# Patient Record
Sex: Male | Born: 1990 | Race: Black or African American | Hispanic: No | Marital: Single | Smoking: Former smoker
Health system: Southern US, Community
[De-identification: ages and names within clinical notes are randomized; demographics above are authoritative.]

## PROBLEM LIST (undated history)

## (undated) DIAGNOSIS — K219 Gastro-esophageal reflux disease without esophagitis: Secondary | ICD-10-CM

## (undated) DIAGNOSIS — E041 Nontoxic single thyroid nodule: Secondary | ICD-10-CM

## (undated) DIAGNOSIS — J302 Other seasonal allergic rhinitis: Secondary | ICD-10-CM

## (undated) DIAGNOSIS — K5090 Crohn's disease, unspecified, without complications: Secondary | ICD-10-CM

## (undated) DIAGNOSIS — K50013 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: Principal | ICD-10-CM

## (undated) HISTORY — PX: COLONOSCOPY: SHX174

## (undated) HISTORY — DX: Crohn's disease, unspecified, without complications: K50.90

## (undated) HISTORY — DX: 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: K50.013

## (undated) HISTORY — DX: Nontoxic single thyroid nodule: E04.1

---
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
## 2012-02-02 ENCOUNTER — Ambulatory Visit
Admission: RE | Admit: 2012-02-02 | Discharge: 2012-02-02 | Disposition: A | Discharge status: Home / Self Care | Payer: Self-pay | Source: Ambulatory Visit | Attending: Gastroenterology | Admitting: Gastroenterology

## 2012-02-02 ENCOUNTER — Other Ambulatory Visit: Payer: Self-pay | Admitting: Gastroenterology

## 2012-02-02 DIAGNOSIS — R1110 Vomiting, unspecified: Secondary | ICD-10-CM

## 2012-02-02 DIAGNOSIS — K5090 Crohn's disease, unspecified, without complications: Secondary | ICD-10-CM

## 2012-02-02 MED ORDER — IOHEXOL 300 MG/ML  SOLN
100.0000 mL | Freq: Once | INTRAMUSCULAR | Status: AC | PRN
  Administered 2012-02-02: 100 mL via INTRAVENOUS

## 2012-02-08 ENCOUNTER — Ambulatory Visit: Payer: Self-pay | Admitting: Pediatrics

## 2012-02-08 ENCOUNTER — Encounter: Payer: Self-pay | Admitting: Pediatrics

## 2012-02-08 ENCOUNTER — Encounter: Payer: Self-pay | Admitting: *Deleted

## 2012-03-21 ENCOUNTER — Other Ambulatory Visit: Payer: Self-pay | Admitting: Gastroenterology

## 2012-03-21 DIAGNOSIS — R112 Nausea with vomiting, unspecified: Secondary | ICD-10-CM

## 2012-03-21 DIAGNOSIS — K5090 Crohn's disease, unspecified, without complications: Secondary | ICD-10-CM

## 2012-03-25 ENCOUNTER — Ambulatory Visit
Admission: RE | Admit: 2012-03-25 | Discharge: 2012-03-25 | Disposition: A | Discharge status: Home / Self Care | Payer: BC Managed Care – PPO | Source: Ambulatory Visit | Attending: Gastroenterology | Admitting: Gastroenterology

## 2012-03-25 DIAGNOSIS — K5090 Crohn's disease, unspecified, without complications: Secondary | ICD-10-CM

## 2012-03-25 DIAGNOSIS — R112 Nausea with vomiting, unspecified: Secondary | ICD-10-CM

## 2012-03-30 ENCOUNTER — Telehealth (INDEPENDENT_AMBULATORY_CARE_PROVIDER_SITE_OTHER): Payer: Self-pay | Admitting: General Surgery

## 2012-03-30 NOTE — Telephone Encounter (Signed)
Left message on machine for patient to call back for appt. Appt made for 04/04/12 @ 3:00 pm per Dr Michaell Cowing to evaluate stricture.

## 2012-03-30 NOTE — Telephone Encounter (Signed)
Spoke to Ririe, his mother, and given appt.

## 2012-04-02 ENCOUNTER — Emergency Department (HOSPITAL_COMMUNITY)
Admission: EM | Admit: 2012-04-02 | Discharge: 2012-04-02 | Disposition: A | Discharge status: Home / Self Care | Payer: BC Managed Care – PPO | Attending: Emergency Medicine | Admitting: Emergency Medicine

## 2012-04-02 ENCOUNTER — Emergency Department (HOSPITAL_COMMUNITY): Payer: BC Managed Care – PPO

## 2012-04-02 ENCOUNTER — Encounter (HOSPITAL_COMMUNITY): Payer: Self-pay

## 2012-04-02 DIAGNOSIS — R109 Unspecified abdominal pain: Secondary | ICD-10-CM | POA: Insufficient documentation

## 2012-04-02 DIAGNOSIS — Z932 Ileostomy status: Secondary | ICD-10-CM | POA: Insufficient documentation

## 2012-04-02 DIAGNOSIS — R1110 Vomiting, unspecified: Secondary | ICD-10-CM | POA: Insufficient documentation

## 2012-04-02 DIAGNOSIS — Z8719 Personal history of other diseases of the digestive system: Secondary | ICD-10-CM

## 2012-04-02 DIAGNOSIS — R10819 Abdominal tenderness, unspecified site: Secondary | ICD-10-CM | POA: Insufficient documentation

## 2012-04-02 DIAGNOSIS — K5090 Crohn's disease, unspecified, without complications: Secondary | ICD-10-CM | POA: Insufficient documentation

## 2012-04-02 LAB — CBC
HCT: 33.9 % — ABNORMAL LOW (ref 39.0–52.0)
Hemoglobin: 12.1 g/dL — ABNORMAL LOW (ref 13.0–17.0)
MCH: 32.6 pg (ref 26.0–34.0)
MCHC: 35.7 g/dL (ref 30.0–36.0)
MCV: 91.4 fL (ref 78.0–100.0)
RBC: 3.71 MIL/uL — ABNORMAL LOW (ref 4.22–5.81)

## 2012-04-02 LAB — COMPREHENSIVE METABOLIC PANEL
Albumin: 4.2 g/dL (ref 3.5–5.2)
Alkaline Phosphatase: 44 U/L (ref 39–117)
BUN: 12 mg/dL (ref 6–23)
Creatinine, Ser: 0.86 mg/dL (ref 0.50–1.35)
GFR calc Af Amer: >90 mL/min (ref >90)
Glucose, Bld: 86 mg/dL (ref 70–99)
Potassium: 3.6 mEq/L (ref 3.5–5.1)
Total Bilirubin: 1.7 mg/dL — ABNORMAL HIGH (ref 0.3–1.2)
Total Protein: 6.4 g/dL (ref 6.0–8.3)

## 2012-04-02 LAB — DIFFERENTIAL
Basophils Relative: 0 % (ref 0–1)
Eosinophils Absolute: 0 10*3/uL (ref 0.0–0.7)
Eosinophils Relative: 1 % (ref 0–5)
Lymphs Abs: 0.7 10*3/uL (ref 0.7–4.0)
Monocytes Absolute: 0.8 10*3/uL (ref 0.1–1.0)
Monocytes Relative: 12 % (ref 3–12)
Neutrophils Relative %: 76 % (ref 43–77)

## 2012-04-02 MED ORDER — SODIUM CHLORIDE 0.9 % IV SOLN
Freq: Once | INTRAVENOUS | Status: AC
  Administered 2012-04-02: 10:00:00 via INTRAVENOUS

## 2012-04-02 MED ORDER — ONDANSETRON HCL 4 MG/2ML IJ SOLN
4.0000 mg | Freq: Once | INTRAMUSCULAR | Status: AC
  Administered 2012-04-02: 4 mg via INTRAVENOUS
  Filled 2012-04-02: qty 2

## 2012-04-02 NOTE — ED Notes (Addendum)
Pt.having vomitng for over 1 week, has a hx of Crohns, diagnosed with stricture at the [facility]  [facility] this week by Ct and Endoscopy.   Pt. Is becoming worse with n/v and pain.  Has an appointment for this MOnday with a surgeon, but mom believes he needs treatment now due to the increase pain, n/v

## 2012-04-02 NOTE — ED Notes (Signed)
Pt c/o (R) mid side abd pain and N/V x1 week, pt reports vomiting x5 yesterday, pt is able to keep down some foods and fluids w/vomiting. Pt denies diarrhea, chest/back pain, or sob. Pt has a hx of Chron's dx and recently had a procedure completed by Dr. Michaell Cowing and is scheduled to see Dr. Michaell Cowing on Monday for another possible abd surgery

## 2012-04-02 NOTE — Discharge Instructions (Signed)

## 2012-04-02 NOTE — ED Notes (Signed)
Patient transported to X-ray 

## 2012-04-02 NOTE — ED Notes (Signed)
Pt undressing and getting into a gown; blankets given; family at bedside

## 2012-04-02 NOTE — ED Provider Notes (Signed)
History     CSN: [id number]  Arrival date & time 04/02/12  4098   First MD Initiated Contact with Patient 04/02/12 [phone]      Chief Complaint  Patient presents with  . Emesis    (Consider location/radiation/quality/duration/timing/severity/associated sxs/prior treatment) HPI Comments: Patient with history of Crohn's Disease status post ileostomy with reversal in the past.  Has been having abd pain and vomiting recently and underwent colonoscopy showing a stenosis of the ileocolic anastomosis.  Presents today complaining of no bowel movement for several days, increased abd pain, and several episodes of vomiting.  Is to see surgery on Monday.  Patient is a 21 y.o. male presenting with vomiting. The history is provided by the patient.  Emesis  This is a recurrent problem. The current episode started 2 days ago. The problem occurs continuously. The problem has been gradually worsening. The emesis has an appearance of stomach contents. There has been no fever. Associated symptoms include abdominal pain. Pertinent negatives include no diarrhea.    Past Medical History  Diagnosis Date  . Crohn's disease 09-10-2006    Confirmed by Colonoscopy  . Crohn's disease     Past Surgical History  Procedure Date  . Abdominal surgery     No family history on file.  History  Substance Use Topics  . Smoking status: Never Smoker   . Smokeless tobacco: Not on file  . Alcohol Use: No      Review of Systems  Gastrointestinal: Positive for vomiting and abdominal pain. Negative for diarrhea.  All other systems reviewed and are negative.    Allergies  Review of patient's allergies indicates no known allergies.  Home Medications   Current Outpatient Rx  Name Route Sig Dispense Refill  . MERCAPTOPURINE 50 MG PO TABS Oral Take 50 mg by mouth daily. Give on an empty stomach 1 hour before or 2 hours after meals. Caution: Chemotherapy.    . OXYCODONE-ACETAMINOPHEN 5-325 MG PO TABS Oral Take 1  tablet by mouth every 4 (four) hours as needed. For pain    . PREDNISONE 20 MG PO TABS Oral Take 20 mg by mouth daily.    Marland Kitchen PROMETHAZINE HCL 25 MG PO TABS Oral Take 25 mg by mouth every 8 (eight) hours as needed. For nausea      BP 131/81  Pulse 71  Temp(Src) 98.3 F (36.8 C) (Oral)  Resp 18  SpO2 100%  Physical Exam  Nursing note and vitals reviewed. Constitutional: He is oriented to person, place, and time. He appears well-developed and well-nourished. No distress.  HENT:  Head: Normocephalic and atraumatic.  Neck: Normal range of motion.  Cardiovascular: Normal rate and regular rhythm.   Pulmonary/Chest: Effort normal and breath sounds normal.  Abdominal: Soft. Bowel sounds are normal. He exhibits no distension. There is tenderness.       TTp in the right mid and lower abdomen.  No rebound or guarding.  Musculoskeletal: Normal range of motion. He exhibits no edema.  Neurological: He is alert and oriented to person, place, and time.  Skin: Skin is warm and dry. He is not diaphoretic.    ED Course  Procedures (including critical care time)   Labs Reviewed  CBC  DIFFERENTIAL  COMPREHENSIVE METABOLIC PANEL   No results found.   No diagnosis found.    MDM  The patient's exam is benign, he appears well.  He is not actively vomiting and the labs are reassuring.  There are minimal small bowel loops on the  xray, but he does not appear to be obstructed clinically.  He is feeling better and will be discharged with follow up with general surgery as scheduled for Monday.  To return prn for increasing pain, fever, or bloody stool.        Geoffery Lyons, MD 04/02/12 1131

## 2012-04-04 ENCOUNTER — Encounter (INDEPENDENT_AMBULATORY_CARE_PROVIDER_SITE_OTHER): Payer: Self-pay | Admitting: Surgery

## 2012-04-04 ENCOUNTER — Ambulatory Visit (INDEPENDENT_AMBULATORY_CARE_PROVIDER_SITE_OTHER): Payer: BC Managed Care – PPO | Admitting: Surgery

## 2012-04-04 ENCOUNTER — Telehealth (INDEPENDENT_AMBULATORY_CARE_PROVIDER_SITE_OTHER): Payer: Self-pay | Admitting: General Surgery

## 2012-04-04 VITALS — BP 126/78 | HR 73 | Temp 99.0°F | Ht 69.75 in | Wt 123.2 lb

## 2012-04-04 DIAGNOSIS — K50013 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: Secondary | ICD-10-CM

## 2012-04-04 DIAGNOSIS — K5090 Crohn's disease, unspecified, without complications: Secondary | ICD-10-CM

## 2012-04-04 DIAGNOSIS — K56699 Other intestinal obstruction unspecified as to partial versus complete obstruction: Secondary | ICD-10-CM

## 2012-04-04 DIAGNOSIS — K56609 Unspecified intestinal obstruction, unspecified as to partial versus complete obstruction: Secondary | ICD-10-CM

## 2012-04-04 HISTORY — DX: 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: K50.013

## 2012-04-04 MED ORDER — ALVIMOPAN 12 MG PO CAPS: 12.0000 mg | ORAL_CAPSULE | ORAL | Status: AC

## 2012-04-04 NOTE — Patient Instructions (Signed)
Crohn's Disease Crohn's disease is a long-term (chronic) soreness and redness (inflammation) of the intestines (bowel). It can affect any portion of the digestive tract, from the mouth to the anus. It can also cause problems outside the digestive tract. Crohn's disease is closely related to a disease called ulcerative colitis (together, these two diseases are called inflammatory bowel disease).   CAUSES   The cause of Crohn's disease is not known. One theory is that, in an easily affected (susceptible) person, the immune system is triggered to attack the body's own digestive tissue. Crohn's disease runs in families. It seems to be more common in certain geographic areas and amongst certain races. There are no clear-cut dietary causes.   SYMPTOMS   Crohn's disease can cause many different symptoms since it can affect many different parts of the body. Symptoms include:  Fatigue.   Weight loss.   Chronic diarrhea, sometime bloody.   Abdominal pain and cramps.   Fever.   Ulcers or canker sores in the mouth or rectum.   Anemia (low red blood cells).   Arthritis, skin problems, and eye problems may occur.  Complications of Crohn's disease can include:  Series of holes (perforation) of the bowel.   Portions of the intestines sticking to each other (adhesions).   Obstruction of the bowel.   Fistula formation, typically in the rectal area but also in other areas. A fistula is an opening between the bowels and the outside, or between the bowels and another organ.   A painful crack in the mucous membrane of the anus (rectal fissure).  DIAGNOSIS   Your caregiver may suspect Crohn's disease based on your symptoms and an exam. Blood tests may confirm that there is a problem. You may be asked to submit a stool specimen for examination. X-rays and CT scans may be necessary. Ultimately, the diagnosis is usually made after a procedure that uses a flexible tube that is inserted via your mouth or your  anus. This is done under sedation and is called either an upper endoscopy or colonoscopy. With these tests, the specialist can take tiny tissue samples and remove them from the inside of the bowel (biopsy). Examination of this biopsy tissue under a microscope can reveal Crohn's disease as the cause of your symptoms. Due to the many different forms that Crohn's disease can take, symptoms may be present for several years before a diagnosis is made. HOME CARE INSTRUCTIONS    There is no cure for Crohn's disease. The best treatment is frequent checkups with your caregiver.   Symptoms such as diarrhea can be controlled with medications. Avoid foods that have a laxative effect such as fresh fruit, vegetables and dairy products. During flare ups, you can rest your bowel by refraining from solid foods. Drink clear liquids frequently during the day (electrolyte or re-hydrating fluids are best. Your caregiver can help you with suggestions). Drink often to prevent loss of body fluids (dehydration). When diarrhea has cleared, eat small meals and more frequently. Avoid food additives and stimulants such as caffeine (coffee, tea, or chocolate). Enzyme supplements may help if you develop intolerance to a sugar in dairy products (lactose). Ask your caregiver or dietitian about specific dietary instructions.   Try to maintain a positive attitude. Learn relaxation techniques such as self hypnosis, mental imaging, and muscle relaxation.   If possible, avoid stresses which can aggravate your condition.   Exercise regularly.   Follow your diet.   Always get plenty of rest.  SEEK MEDICAL   CARE IF:    Your symptoms fail to improve after a week or two of new treatment.   You experience continued weight loss.   You have ongoing crampy digestion or loose bowels.   You develop a new skin rash, skin sores, or eye problems.  SEEK IMMEDIATE MEDICAL CARE IF:    You have worsening of your symptoms or develop new  symptoms.   You have a fever.   You develop bloody diarrhea.   You develop severe abdominal pain.  MAKE SURE YOU:    Understand these instructions.   Will watch your condition.   Will get help right away if you are not doing well or get worse.  Document Released: 08/19/2005 Document Revised: 10/29/2011 Document Reviewed: 07/18/2007 [employer] Patient Information 2012 [employer]. 

## 2012-04-04 NOTE — Progress Notes (Signed)
Subjective:     Patient ID: Nathan Freiman Jr., male   DOB: 05/24/1991, 21 y.o.   MRN: [id number]  HPI  Nathan Lavallie Jr.  10/07/1991 [id number]  Patient Care Team: Mary J Baxley, MD as PCP - General (Internal Medicine) Patrick D Hung, MD as Consulting Physician (Gastroenterology)  This patient is a 21 y.o.male who presents today for surgical evaluation at the request of Dr. Hung.   Patient is a pleasant young male. He had a perforation in his mid teens. Required resection and ileostomy. Was found to have Crohn's. Was placed on immunosuppression. He was sent to me for ileostomy takedown. I did this 5 years ago. He has done well on 6-mercaptopurine. His mother works in the medical field and endoscopy.  A few months ago, he noticed crampy intermittent abdominal pain. CAT scan showed evidence of anastomotic stricturing. No inflammation. No fistula. Was placed on high dose steroids. Had some mild improvement but could not taper down. As low as 10 but back up to 20mg. He has lost 17 pounds over the past few months. Still with intermittent nausea and vomiting. He modified his diet.  Dr. Hung on  endoscopy noted a stricture ~ 95%. Heart long and was. He was hesitant to do any dilation due to risk of perforation since ileocolonic. He therefore sent the patient for evaluation to see if he would benefit from surgery  Patient Active Problem List  Diagnoses  . Nontoxic nodular goiter  . Crohn's disease  . 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 Crohn's disease 09-10-2006    Confirmed by Colonoscopy  . Thyroid nodule     Past Surgical History  Procedure Date  . Abdominal surgery   . Appendectomy 06/2006  . Small bowel obstruction 10/2006  . Ileostomy takedown 02/2007  . 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 Marital Status: Single    Spouse Name: N/A    Number of Children: N/A  . Years of Education: N/A    Occupational History  . Not on file.   Social History Main Topics  . Smoking status: Former Smoker  . Smokeless tobacco: Former User    Quit date: 02/03/2012  . Alcohol Use: No  . Drug Use: No  . Sexually Active:    Other Topics Concern  . Not on file   Social History Narrative  . No narrative on file    Family History  Problem Relation Age of Onset  . Hypertension Father     Current Outpatient Prescriptions  Medication Sig Dispense Refill  . mercaptopurine (PURINETHOL) 50 MG tablet Take 50 mg by mouth daily. Give on an empty stomach 1 hour before or 2 hours after meals. Caution: Chemotherapy.      . oxyCODONE-acetaminophen (PERCOCET) 5-325 MG per tablet Take 1 tablet by mouth every 4 (four) hours as needed. For pain      . predniSONE (DELTASONE) 20 MG tablet Take 20 mg by mouth daily.      . promethazine (PHENERGAN) 25 MG tablet Take 25 mg by mouth every 8 (eight) hours as needed. For nausea         No Known Allergies  BP 126/78  Pulse 73  Temp(Src) 99 F (37.2 C) (Temporal)  Ht 5' 9.75" (1.772 m)  Wt 123 lb 3.2 oz (55.883 kg)  BMI 17.80 kg/m2  SpO2 98%  Dg Small Bowel  03/25/2012  *RADIOLOGY REPORT*  Clinical   Data:  Crohn disease, prior ileocolonic resection, abdominal pain, nausea/vomiting  SMALL BOWEL SERIES  Technique:  Following ingestion of a mixture of thin barium and Entero Vu, serial small bowel images were obtained including spot views of the terminal ileum.  Fluoroscopy time:  1.1 minutes.  Comparison:  CT abdomen pelvis dated 02/02/2012  Findings:  Scout radiograph demonstrates suture lines in the right mid abdomen from prior ileocolonic resection.  Delayed small bowel transit, taking approximately 4 hours to get from the proximal duodenum to proximal colon.  Mildly prominent jejunal loops in the left mid abdomen.  Dilated loops of ileum in the right mid and lower abdomen.  No definite mucosal irregularity to suggest active inflammation.  No fistula is seen.   Marked narrowing at the ileocolonic anastomosis.  Visualized partially opacified ascending/transverse colon is decompressed.  IMPRESSION: Findings compatible with partial small bowel obstruction secondary to fixed narrowing/stricture at the ileocolonic anastomosis.  No definite mucosal irregularity to suggest active inflammation. No fistula is seen.  Original Report Authenticated By: SRIYESH KRISHNAN, M.D.   Dg Abd Acute W/chest  04/02/2012  *RADIOLOGY REPORT*  Clinical Data: Luminal pain, vomiting, history of Crohn's disease  ACUTE ABDOMEN SERIES (ABDOMEN 2 VIEW & CHEST 1 VIEW)  Comparison: 03/25/2012  Findings: Cardiomediastinal silhouette is unremarkable.  No acute infiltrate or pleural effusion.  No pulmonary edema.  No free abdominal air.  Stable postsurgical changes right mid abdomen.  Minimal distended small bowel loops lower abdomen suspicious for ileus or early partial bowel obstruction.  IMPRESSION: No acute disease within chest.  Minimal distended small bowel loops in the lower abdomen suspicious for ileus or early partial bowel obstruction.  Original Report Authenticated By: LIVIU POP, M.D.     Review of Systems  Constitutional: Positive for unexpected weight change. Negative for fever, chills and diaphoresis.  HENT: Negative for nosebleeds, sore throat, facial swelling, mouth sores, trouble swallowing, neck pain and ear discharge.   Eyes: Negative for photophobia, discharge and visual disturbance.  Respiratory: Negative for choking, chest tightness, shortness of breath and stridor.   Cardiovascular: Negative for chest pain and palpitations.  Gastrointestinal: Positive for nausea, vomiting, abdominal pain and abdominal distention. Negative for diarrhea, constipation, blood in stool, anal bleeding and rectal pain.  Genitourinary: Negative for dysuria, urgency, difficulty urinating and testicular pain.  Musculoskeletal: Negative for myalgias, back pain, arthralgias and gait problem.  Skin:  Negative for color change, pallor, rash and wound.  Neurological: Negative for dizziness, speech difficulty, weakness, numbness and headaches.  Hematological: Negative for adenopathy. Does not bruise/bleed easily.  Psychiatric/Behavioral: Negative for hallucinations, confusion and agitation.       Objective:   Physical Exam  Constitutional: He is oriented to person, place, and time. He appears well-developed. He appears cachectic.  Non-toxic appearance. He does not have a sickly appearance. No distress.  HENT:  Head: Normocephalic.  Mouth/Throat: Oropharynx is clear and moist. No oropharyngeal exudate.  Eyes: Conjunctivae and EOM are normal. Pupils are equal, round, and reactive to light. No scleral icterus.  Neck: Normal range of motion. Neck supple. No tracheal deviation present.  Cardiovascular: Normal rate, regular rhythm and intact distal pulses.   Pulmonary/Chest: Effort normal and breath sounds normal. No respiratory distress.  Abdominal: Soft. Bowel sounds are normal. He exhibits no distension. There is tenderness in the right lower quadrant. There is no rigidity, no guarding and no CVA tenderness. No hernia. Hernia confirmed negative in the right inguinal area and confirmed negative in the left inguinal area.      Musculoskeletal: Normal range of motion. He exhibits no tenderness.  Lymphadenopathy:    He has no cervical adenopathy.       Right: No inguinal adenopathy present.       Left: No inguinal adenopathy present.  Neurological: He is alert and oriented to person, place, and time. No cranial nerve deficit. He exhibits normal muscle tone. Coordination normal.  Skin: Skin is warm and dry. No rash noted. He is not diaphoretic. No erythema. No pallor.  Psychiatric: He has a normal mood and affect. His behavior is normal. Judgment and thought content normal.       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 I agree with Doctor Hung that trying to do dilation at the anastomosis is risky. Of course, surgery in an immunosuppressed patient with prior operations and Crohn's has risks as  well. I did offer them for a second opinion at a major academic center such as [facility] to see what other options are available.   His options are limited and I think he requires reoperation. I'll try this laparoscopically. However, with his history of keloid formation and prior abdominal surgeries x2, however a low threshold to convert to an open resection. His mother and father agree.  The anatomy & physiology of the digestive tract was discussed.  The pathophysiology was discussed.  Natural history risks without surgery was discussed.   I feel the risks of no intervention will lead to serious problems that outweigh the operative risks; therefore, I recommended a partial colectomy to remove the pathology.  Laparoscopic & open techniques were discussed.   Risks such as bleeding, infection, abscess, leak, reoperation, possible ostomy, hernia, heart attack, death, and other risks were discussed.  I noted a good likelihood this will help address the problem.   Goals of post-operative recovery were discussed as well.  We will work to minimize complications.  An educational handout on the pathology was given as well.  Questions were answered.  The patient expresses understanding & wishes to proceed with surgery.       

## 2012-04-04 NOTE — Progress Notes (Signed)
Addended by: Ardeth Sportsman on: 04/04/2012 04:34 PM   Modules accepted: Orders

## 2012-04-07 ENCOUNTER — Encounter (HOSPITAL_COMMUNITY)
Admission: RE | Admit: 2012-04-07 | Discharge: 2012-04-07 | Disposition: A | Discharge status: Home / Self Care | Payer: BC Managed Care – PPO | Source: Ambulatory Visit | Attending: Surgery | Admitting: Surgery

## 2012-04-07 ENCOUNTER — Encounter (HOSPITAL_COMMUNITY): Payer: Self-pay | Admitting: Pharmacy Technician

## 2012-04-07 ENCOUNTER — Encounter (HOSPITAL_COMMUNITY): Payer: Self-pay

## 2012-04-07 HISTORY — DX: Gastro-esophageal reflux disease without esophagitis: K21.9

## 2012-04-07 HISTORY — DX: Other seasonal allergic rhinitis: J30.2

## 2012-04-07 LAB — CBC
HCT: 31.7 % — ABNORMAL LOW (ref 39.0–52.0)
Platelets: 290 10*3/uL (ref 150–400)
RBC: 3.39 MIL/uL — ABNORMAL LOW (ref 4.22–5.81)
RDW: 20.3 % — ABNORMAL HIGH (ref 11.5–15.5)
WBC: 7.4 10*3/uL (ref 4.0–10.5)

## 2012-04-07 LAB — BASIC METABOLIC PANEL
GFR calc Af Amer: >90 mL/min (ref >90)
GFR calc non Af Amer: >90 mL/min (ref >90)
Glucose, Bld: 79 mg/dL (ref 70–99)
Potassium: 3.5 mEq/L (ref 3.5–5.1)
Sodium: 137 mEq/L (ref 135–145)

## 2012-04-07 LAB — TYPE AND SCREEN: Antibody Screen: NEGATIVE

## 2012-04-07 LAB — SURGICAL PCR SCREEN
MRSA, PCR: NEGATIVE
Staphylococcus aureus: NEGATIVE

## 2012-04-07 MED ORDER — CHLORHEXIDINE GLUCONATE 4 % EX LIQD: 1.0000 "application " | Freq: Once | CUTANEOUS | Status: DC

## 2012-04-07 NOTE — Progress Notes (Signed)
Pt doesn't have a cardiologist  Denies stress test/echo/heart cath  Medical MD is Dr.Bexley

## 2012-04-07 NOTE — Pre-Procedure Instructions (Signed)
22 Radames Mejorado.  04/07/2012   Your procedure is scheduled on:  Wed, May 29 @ 8:30 AM  Report to [facility] at 6:30 AM.  Call this number if you have problems the morning of surgery: [phone]   Remember:   Do not eat food:After Midnight.  May have clear liquids: up to 4 Hours before arrival(until 2:30 am)  Clear liquids include soda, tea, black coffee, apple or grape juice, broth,water  Take these medicines the morning of surgery with A SIP OF WATER: Phenergan(if needed),Pain Pill if needed),and Purinethol   Do not wear jewelry  Do not wear lotions, powders, or cologne   Men may shave face and neck.  Do not bring valuables to the hospital.  Contacts, dentures or bridgework may not be worn into surgery.  Leave suitcase in the car. After surgery it may be brought to your room.  For patients admitted to the hospital, checkout time is 11:00 AM the day of discharge.   Special Instructions: CHG Shower Use Special Wash: 1/2 bottle night before surgery and 1/2 bottle morning of surgery. and N/A   Please read over the following fact sheets that you were given: Coughing/Deep Breathing,MRSA,Pain Information,Blood Transfusion form,S&S of infection

## 2012-04-19 MED ORDER — CEFOXITIN SODIUM 2 G IV SOLR
2.0000 g | INTRAVENOUS | Status: AC
  Administered 2012-04-20: 2 g via INTRAVENOUS
  Filled 2012-04-19: qty 2

## 2012-04-20 ENCOUNTER — Inpatient Hospital Stay (HOSPITAL_COMMUNITY)
Admission: RE | Admit: 2012-04-20 | Discharge: 2012-04-24 | DRG: 148 | Disposition: A | Discharge status: Home / Self Care | Payer: BC Managed Care – PPO | Source: Ambulatory Visit | Attending: Surgery | Admitting: Surgery

## 2012-04-20 ENCOUNTER — Encounter (HOSPITAL_COMMUNITY): Payer: Self-pay | Admitting: Surgery

## 2012-04-20 ENCOUNTER — Ambulatory Visit (HOSPITAL_COMMUNITY): Payer: BC Managed Care – PPO | Admitting: Anesthesiology

## 2012-04-20 ENCOUNTER — Encounter (HOSPITAL_COMMUNITY)
Admission: RE | Disposition: A | Discharge status: Home / Self Care | Payer: Self-pay | Source: Ambulatory Visit | Attending: Surgery

## 2012-04-20 ENCOUNTER — Encounter (HOSPITAL_COMMUNITY): Payer: Self-pay | Admitting: Anesthesiology

## 2012-04-20 DIAGNOSIS — K5080 Crohn's disease of both small and large intestine without complications: Secondary | ICD-10-CM | POA: Diagnosis present

## 2012-04-20 DIAGNOSIS — Y92009 Unspecified place in unspecified non-institutional (private) residence as the place of occurrence of the external cause: Secondary | ICD-10-CM

## 2012-04-20 DIAGNOSIS — K50013 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: Secondary | ICD-10-CM | POA: Insufficient documentation

## 2012-04-20 DIAGNOSIS — K929 Disease of digestive system, unspecified: Principal | ICD-10-CM | POA: Diagnosis present

## 2012-04-20 DIAGNOSIS — K660 Peritoneal adhesions (postprocedural) (postinfection): Secondary | ICD-10-CM

## 2012-04-20 DIAGNOSIS — K56699 Other intestinal obstruction unspecified as to partial versus complete obstruction: Secondary | ICD-10-CM

## 2012-04-20 DIAGNOSIS — K5090 Crohn's disease, unspecified, without complications: Secondary | ICD-10-CM | POA: Insufficient documentation

## 2012-04-20 DIAGNOSIS — E049 Nontoxic goiter, unspecified: Secondary | ICD-10-CM | POA: Diagnosis present

## 2012-04-20 DIAGNOSIS — K565 Intestinal adhesions [bands], unspecified as to partial versus complete obstruction: Secondary | ICD-10-CM | POA: Diagnosis present

## 2012-04-20 DIAGNOSIS — Z8249 Family history of ischemic heart disease and other diseases of the circulatory system: Secondary | ICD-10-CM

## 2012-04-20 DIAGNOSIS — Y832 Surgical operation with anastomosis, bypass or graft as the cause of abnormal reaction of the patient, or of later complication, without mention of misadventure at the time of the procedure: Secondary | ICD-10-CM | POA: Diagnosis present

## 2012-04-20 DIAGNOSIS — IMO0002 Reserved for concepts with insufficient information to code with codable children: Secondary | ICD-10-CM

## 2012-04-20 DIAGNOSIS — Z87891 Personal history of nicotine dependence: Secondary | ICD-10-CM

## 2012-04-20 HISTORY — PX: COLON RESECTION: SHX5231

## 2012-04-20 HISTORY — PX: LAPAROSCOPIC COLON RESECTION: SUR791

## 2012-04-20 LAB — CBC
HCT: 28.7 % — ABNORMAL LOW (ref 39.0–52.0)
Hemoglobin: 10.4 g/dL — ABNORMAL LOW (ref 13.0–17.0)
MCHC: 36.2 g/dL — ABNORMAL HIGH (ref 30.0–36.0)
MCV: 95 fL (ref 78.0–100.0)

## 2012-04-20 LAB — CREATININE, SERUM: GFR calc non Af Amer: >90 mL/min (ref >90)

## 2012-04-20 SURGERY — COLON RESECTION LAPAROSCOPIC
Anesthesia: General | Site: Abdomen

## 2012-04-20 MED ORDER — ACETAMINOPHEN 10 MG/ML IV SOLN
INTRAVENOUS | Status: AC
  Filled 2012-04-20: qty 100

## 2012-04-20 MED ORDER — PROMETHAZINE HCL 25 MG/ML IJ SOLN: 12.5000 mg | Freq: Four times a day (QID) | INTRAMUSCULAR | Status: DC | PRN

## 2012-04-20 MED ORDER — LACTATED RINGERS IV SOLN
INTRAVENOUS | Status: DC
  Administered 2012-04-20 – 2012-04-22 (×4): via INTRAVENOUS
  Administered 2012-04-22: 10 mL/h via INTRAVENOUS

## 2012-04-20 MED ORDER — HYDROMORPHONE HCL PF 1 MG/ML IJ SOLN
0.5000 mg | INTRAMUSCULAR | Status: DC | PRN
  Administered 2012-04-20: 1 mg via INTRAVENOUS
  Administered 2012-04-20: 2 mg via INTRAVENOUS
  Administered 2012-04-20 (×2): 1 mg via INTRAVENOUS
  Administered 2012-04-20 – 2012-04-22 (×11): 2 mg via INTRAVENOUS
  Filled 2012-04-20: qty 1
  Filled 2012-04-20 (×9): qty 2
  Filled 2012-04-20: qty 1
  Filled 2012-04-20 (×3): qty 2

## 2012-04-20 MED ORDER — BUPIVACAINE 0.25 % ON-Q PUMP DUAL CATH 300 ML
300.0000 mL | INJECTION | Status: AC
  Administered 2012-04-20: 300 mL
  Filled 2012-04-20: qty 300

## 2012-04-20 MED ORDER — HEPARIN SODIUM (PORCINE) 5000 UNIT/ML IJ SOLN
5000.0000 [IU] | Freq: Three times a day (TID) | INTRAMUSCULAR | Status: DC
  Administered 2012-04-21 – 2012-04-24 (×10): 5000 [IU] via SUBCUTANEOUS
  Filled 2012-04-20 (×13): qty 1

## 2012-04-20 MED ORDER — LACTATED RINGERS IV SOLN
INTRAVENOUS | Status: DC | PRN
  Administered 2012-04-20 (×2): via INTRAVENOUS

## 2012-04-20 MED ORDER — ALBUMIN HUMAN 5 % IV SOLN
INTRAVENOUS | Status: DC | PRN
  Administered 2012-04-20: 11:00:00 via INTRAVENOUS

## 2012-04-20 MED ORDER — DEXAMETHASONE SODIUM PHOSPHATE 4 MG/ML IJ SOLN
INTRAMUSCULAR | Status: DC | PRN
  Administered 2012-04-20: 4 mg via INTRAVENOUS

## 2012-04-20 MED ORDER — MAGIC MOUTHWASH
15.0000 mL | Freq: Four times a day (QID) | ORAL | Status: DC | PRN
  Filled 2012-04-20: qty 15

## 2012-04-20 MED ORDER — PREDNISONE 20 MG PO TABS
20.0000 mg | ORAL_TABLET | Freq: Every day | ORAL | Status: DC
  Administered 2012-04-20 – 2012-04-24 (×5): 20 mg via ORAL
  Filled 2012-04-20 (×6): qty 1

## 2012-04-20 MED ORDER — PSYLLIUM 95 % PO PACK
1.0000 | PACK | Freq: Two times a day (BID) | ORAL | Status: DC
  Administered 2012-04-20 – 2012-04-23 (×8): 1 via ORAL
  Filled 2012-04-20 (×10): qty 1

## 2012-04-20 MED ORDER — FENTANYL CITRATE 0.05 MG/ML IJ SOLN
INTRAMUSCULAR | Status: DC | PRN
  Administered 2012-04-20: 25 ug via INTRAVENOUS
  Administered 2012-04-20 (×3): 50 ug via INTRAVENOUS
  Administered 2012-04-20: 25 ug via INTRAVENOUS
  Administered 2012-04-20: 50 ug via INTRAVENOUS

## 2012-04-20 MED ORDER — GLYCOPYRROLATE 0.2 MG/ML IJ SOLN
INTRAMUSCULAR | Status: DC | PRN
  Administered 2012-04-20: 0.6 mg via INTRAVENOUS

## 2012-04-20 MED ORDER — SACCHAROMYCES BOULARDII 250 MG PO CAPS
250.0000 mg | ORAL_CAPSULE | Freq: Two times a day (BID) | ORAL | Status: DC
  Administered 2012-04-20 – 2012-04-23 (×8): 250 mg via ORAL
  Filled 2012-04-20 (×10): qty 1

## 2012-04-20 MED ORDER — LACTATED RINGERS IV BOLUS (SEPSIS): 1000.0000 mL | Freq: Three times a day (TID) | INTRAVENOUS | Status: AC | PRN

## 2012-04-20 MED ORDER — ACETAMINOPHEN 10 MG/ML IV SOLN
INTRAVENOUS | Status: DC | PRN
  Administered 2012-04-20: 1000 mg via INTRAVENOUS

## 2012-04-20 MED ORDER — ONDANSETRON HCL 4 MG/2ML IJ SOLN
4.0000 mg | Freq: Four times a day (QID) | INTRAMUSCULAR | Status: DC | PRN
  Administered 2012-04-20 – 2012-04-23 (×4): 4 mg via INTRAVENOUS
  Filled 2012-04-20 (×4): qty 2

## 2012-04-20 MED ORDER — HYDROMORPHONE HCL PF 1 MG/ML IJ SOLN
INTRAMUSCULAR | Status: AC
  Filled 2012-04-20: qty 1

## 2012-04-20 MED ORDER — GENTAMICIN SULFATE 40 MG/ML IJ SOLN
INTRAMUSCULAR | Status: AC
  Administered 2012-04-20: 11:00:00
  Filled 2012-04-20: qty 1000

## 2012-04-20 MED ORDER — DIPHENHYDRAMINE HCL 50 MG/ML IJ SOLN
12.5000 mg | Freq: Four times a day (QID) | INTRAMUSCULAR | Status: DC | PRN
  Administered 2012-04-20 – 2012-04-21 (×2): 12.5 mg via INTRAVENOUS
  Administered 2012-04-21: 25 mg via INTRAVENOUS
  Administered 2012-04-21 (×2): 12.5 mg via INTRAVENOUS
  Filled 2012-04-20 (×5): qty 1

## 2012-04-20 MED ORDER — MERCAPTOPURINE 50 MG PO TABS
50.0000 mg | ORAL_TABLET | Freq: Every day | ORAL | Status: DC
  Administered 2012-04-21 – 2012-04-23 (×3): 50 mg via ORAL
  Filled 2012-04-20 (×4): qty 1

## 2012-04-20 MED ORDER — PROPOFOL 10 MG/ML IV EMUL
INTRAVENOUS | Status: DC | PRN
  Administered 2012-04-20: 170 mg via INTRAVENOUS

## 2012-04-20 MED ORDER — HYDROMORPHONE HCL PF 1 MG/ML IJ SOLN
0.2500 mg | INTRAMUSCULAR | Status: DC | PRN
  Administered 2012-04-20 (×2): 0.5 mg via INTRAVENOUS

## 2012-04-20 MED ORDER — BUPIVACAINE-EPINEPHRINE 0.25% -1:200000 IJ SOLN
INTRAMUSCULAR | Status: DC | PRN
  Administered 2012-04-20: 50 mL

## 2012-04-20 MED ORDER — ROCURONIUM BROMIDE 100 MG/10ML IV SOLN
INTRAVENOUS | Status: DC | PRN
  Administered 2012-04-20: 50 mg via INTRAVENOUS
  Administered 2012-04-20 (×2): 10 mg via INTRAVENOUS

## 2012-04-20 MED ORDER — ALUM & MAG HYDROXIDE-SIMETH 200-200-20 MG/5ML PO SUSP: 30.0000 mL | Freq: Four times a day (QID) | ORAL | Status: DC | PRN

## 2012-04-20 MED ORDER — ONDANSETRON HCL 4 MG/2ML IJ SOLN
INTRAMUSCULAR | Status: DC | PRN
  Administered 2012-04-20: 4 mg via INTRAVENOUS

## 2012-04-20 MED ORDER — MIDAZOLAM HCL 5 MG/5ML IJ SOLN
INTRAMUSCULAR | Status: DC | PRN
  Administered 2012-04-20 (×2): 1 mg via INTRAVENOUS

## 2012-04-20 MED ORDER — LIP MEDEX EX OINT
1.0000 "application " | TOPICAL_OINTMENT | Freq: Two times a day (BID) | CUTANEOUS | Status: DC
  Administered 2012-04-20 – 2012-04-23 (×7): 1 via TOPICAL
  Filled 2012-04-20: qty 7

## 2012-04-20 MED ORDER — 0.9 % SODIUM CHLORIDE (POUR BTL) OPTIME
TOPICAL | Status: DC | PRN
  Administered 2012-04-20: 5000 mL

## 2012-04-20 MED ORDER — ACETAMINOPHEN 325 MG PO TABS
650.0000 mg | ORAL_TABLET | Freq: Four times a day (QID) | ORAL | Status: DC
  Administered 2012-04-20 – 2012-04-23 (×15): 650 mg via ORAL
  Filled 2012-04-20 (×15): qty 2

## 2012-04-20 MED ORDER — SODIUM CHLORIDE 0.9 % IR SOLN
Status: DC | PRN
  Administered 2012-04-20: 1000 mL

## 2012-04-20 MED ORDER — NEOSTIGMINE METHYLSULFATE 1 MG/ML IJ SOLN
INTRAMUSCULAR | Status: DC | PRN
  Administered 2012-04-20: 4 mg via INTRAVENOUS

## 2012-04-20 SURGICAL SUPPLY — 87 items
APPLICATOR COTTON TIP 6IN STRL (MISCELLANEOUS) ×2 IMPLANT
APPLIER CLIP 5 13 M/L LIGAMAX5 (MISCELLANEOUS)
APPLIER CLIP ROT 10 11.4 M/L (STAPLE)
BAG DECANTER FOR FLEXI CONT (MISCELLANEOUS) ×2 IMPLANT
BLADE SURG 10 STRL SS (BLADE) ×2 IMPLANT
CANISTER SUCTION 2500CC (MISCELLANEOUS) ×8 IMPLANT
CATH KIT ON Q 7.5IN SLV (PAIN MANAGEMENT) ×4 IMPLANT
CELLS DAT CNTRL 66122 CELL SVR (MISCELLANEOUS) IMPLANT
CHLORAPREP W/TINT 26ML (MISCELLANEOUS) ×2 IMPLANT
CLIP APPLIE 5 13 M/L LIGAMAX5 (MISCELLANEOUS) IMPLANT
CLIP APPLIE ROT 10 11.4 M/L (STAPLE) IMPLANT
CLOTH BEACON ORANGE TIMEOUT ST (SAFETY) ×2 IMPLANT
COVER SURGICAL LIGHT HANDLE (MISCELLANEOUS) ×2 IMPLANT
DRAPE INCISE IOBAN 66X45 STRL (DRAPES) IMPLANT
DRAPE PROXIMA HALF (DRAPES) ×2 IMPLANT
DRAPE WARM FLUID 44X44 (DRAPE) ×2 IMPLANT
DRSG TEGADERM 4X4.75 (GAUZE/BANDAGES/DRESSINGS) ×2 IMPLANT
ELECT CAUTERY BLADE 6.4 (BLADE) ×2 IMPLANT
ELECT REM PT RETURN 9FT ADLT (ELECTROSURGICAL) ×2
ELECTRODE REM PT RTRN 9FT ADLT (ELECTROSURGICAL) ×1 IMPLANT
GEL ULTRASOUND 20GR AQUASONIC (MISCELLANEOUS) IMPLANT
GELPOINT ADV PLATFORM (ENDOMECHANICALS)
GLOVE BIO SURGEON STRL SZ7.5 (GLOVE) ×4 IMPLANT
GLOVE BIOGEL PI IND STRL 7.0 (GLOVE) ×2 IMPLANT
GLOVE BIOGEL PI IND STRL 8 (GLOVE) ×1 IMPLANT
GLOVE BIOGEL PI INDICATOR 7.0 (GLOVE) ×2
GLOVE BIOGEL PI INDICATOR 8 (GLOVE) ×1
GLOVE ECLIPSE 7.0 STRL STRAW (GLOVE) ×2 IMPLANT
GLOVE ECLIPSE 8.0 STRL XLNG CF (GLOVE) ×6 IMPLANT
GLOVE SURG SS PI 7.0 STRL IVOR (GLOVE) ×2 IMPLANT
GOWN PREVENTION PLUS XLARGE (GOWN DISPOSABLE) ×2 IMPLANT
GOWN STRL NON-REIN LRG LVL3 (GOWN DISPOSABLE) ×4 IMPLANT
KIT BASIN OR (CUSTOM PROCEDURE TRAY) ×2 IMPLANT
KIT ROOM TURNOVER OR (KITS) ×2 IMPLANT
LEGGING LITHOTOMY PAIR STRL (DRAPES) ×2 IMPLANT
LIGASURE 5MM LAPAROSCOPIC (INSTRUMENTS) IMPLANT
NEEDLE 22X1 1/2 (OR ONLY) (NEEDLE) ×2 IMPLANT
NS IRRIG 1000ML POUR BTL (IV SOLUTION) ×4 IMPLANT
PAD ARMBOARD 7.5X6 YLW CONV (MISCELLANEOUS) ×4 IMPLANT
PENCIL BUTTON HOLSTER BLD 10FT (ELECTRODE) ×2 IMPLANT
PLATFORM STD W/COL CELL SVR (ENDOMECHANICALS) IMPLANT
RTRCTR WOUND ALEXIS 18CM MED (MISCELLANEOUS)
SCALPEL HARMONIC ACE (MISCELLANEOUS) IMPLANT
SCISSORS LAP 5X35 DISP (ENDOMECHANICALS) ×2 IMPLANT
SEALER TISSUE G2 CVD JAW 35 (ENDOMECHANICALS) IMPLANT
SEALER TISSUE G2 CVD JAW 45CM (ENDOMECHANICALS)
SET IRRIG TUBING LAPAROSCOPIC (IRRIGATION / IRRIGATOR) ×2 IMPLANT
SLEEVE Z-THREAD 5X100MM (TROCAR) ×2 IMPLANT
SPECIMEN JAR LARGE (MISCELLANEOUS) IMPLANT
SPECIMEN JAR MEDIUM (MISCELLANEOUS) ×2 IMPLANT
SPECIMEN JAR X LARGE (MISCELLANEOUS) ×2 IMPLANT
SPONGE GAUZE 4X4 12PLY (GAUZE/BANDAGES/DRESSINGS) ×2 IMPLANT
SPONGE LAP 18X18 X RAY DECT (DISPOSABLE) ×2 IMPLANT
STAPLER 90 3.5 STAND SLIM (STAPLE) ×2
STAPLER 90 3.5 STD SLIM (STAPLE) ×1 IMPLANT
STAPLER PROXIMATE 75MM BLUE (STAPLE) ×2 IMPLANT
STAPLER VISISTAT 35W (STAPLE) ×2 IMPLANT
SURGILUBE 2OZ TUBE FLIPTOP (MISCELLANEOUS) IMPLANT
SUT PDS AB 1 TP1 96 (SUTURE) ×4 IMPLANT
SUT PROLENE 2 0 CT2 30 (SUTURE) IMPLANT
SUT PROLENE 2 0 KS (SUTURE) IMPLANT
SUT SILK 2 0 (SUTURE) ×1
SUT SILK 2 0 SH CR/8 (SUTURE) ×2 IMPLANT
SUT SILK 2-0 18XBRD TIE 12 (SUTURE) ×1 IMPLANT
SUT SILK 3 0 (SUTURE) ×1
SUT SILK 3 0 SH CR/8 (SUTURE) ×2 IMPLANT
SUT SILK 3-0 18XBRD TIE 12 (SUTURE) ×1 IMPLANT
SUT VIC AB 2-0 SH 18 (SUTURE) ×2 IMPLANT
SUT VIC AB 3-0 SH 18 (SUTURE) ×6 IMPLANT
SUT VICRYL 0 TIES 12 18 (SUTURE) IMPLANT
SYS LAPSCP GELPORT 120MM (MISCELLANEOUS) ×2
SYSTEM LAPSCP GELPORT 120MM (MISCELLANEOUS) ×1 IMPLANT
TAPE UMBILICAL 1/8 X36 TWILL (MISCELLANEOUS) IMPLANT
TOWEL OR 17X24 6PK STRL BLUE (TOWEL DISPOSABLE) ×2 IMPLANT
TOWEL OR 17X26 10 PK STRL BLUE (TOWEL DISPOSABLE) ×2 IMPLANT
TRAY FOLEY CATH 14FRSI W/METER (CATHETERS) ×2 IMPLANT
TRAY LAPAROSCOPIC (CUSTOM PROCEDURE TRAY) ×2 IMPLANT
TRAY PROCTOSCOPIC FIBER OPTIC (SET/KITS/TRAYS/PACK) IMPLANT
TROCAR FALLER TUNNELING (TROCAR) ×2 IMPLANT
TROCAR KII 12MM C0R29 THR SEP (TROCAR) IMPLANT
TROCAR XCEL NON-BLD 5MMX100MML (ENDOMECHANICALS) ×2 IMPLANT
TROCAR Z-THREAD FIOS 5X100MM (TROCAR) ×2 IMPLANT
TUBE CONNECTING 12X1/4 (SUCTIONS) ×2 IMPLANT
TUBING FILTER THERMOFLATOR (ELECTROSURGICAL) ×2 IMPLANT
TUNNELER SHEATH ON-Q 16GX12 DP (PAIN MANAGEMENT) ×2 IMPLANT
WATER STERILE IRR 1000ML POUR (IV SOLUTION) IMPLANT
YANKAUER SUCT BULB TIP NO VENT (SUCTIONS) ×4 IMPLANT

## 2012-04-20 NOTE — Progress Notes (Signed)
UR complete 

## 2012-04-20 NOTE — H&P (View-Only) (Signed)
Subjective:     Patient ID: Nathan Lamb., male   DOB: [birth year], 21 y.o.   MRN: [id number]  HPI  Nathan Lamb  20-Dec-1990 [id number]  Patient Care Team: Margaree Mackintosh, MD as PCP - General (Internal Medicine) Theda Belfast, MD as Consulting Physician (Gastroenterology)  This patient is a 21 y.o.male who presents today for surgical evaluation at the request of Dr. Elnoria Howard.   Patient is a pleasant young male. He had a perforation in his mid teens. Required resection and ileostomy. Was found to have Crohn's. Was placed on immunosuppression. He was sent to me for ileostomy takedown. I did this 5 years ago. He has done well on 6-mercaptopurine. His mother works in the medical field and endoscopy.  A few months ago, he noticed crampy intermittent abdominal pain. CAT scan showed evidence of anastomotic stricturing. No inflammation. No fistula. Was placed on high dose steroids. Had some mild improvement but could not taper down. As low as 10 but back up to 20mg . He has lost 17 pounds over the past few months. Still with intermittent nausea and vomiting. He modified his diet.  Dr. Elnoria Howard on  endoscopy noted a stricture ~ 95%. Heart long and was. He was hesitant to do any dilation due to risk of perforation since ileocolonic. He therefore sent the patient for evaluation to see if he would benefit from surgery  Patient Active Problem List  Diagnoses  . Nontoxic nodular goiter  . Crohn's disease  . 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 Crohn's disease 09-10-2006    Confirmed by Colonoscopy  . Thyroid nodule     Past Surgical History  Procedure Date  . Abdominal surgery   . Appendectomy 06/2006  . Small bowel obstruction 10/2006  . Ileostomy takedown 02/2007  . 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 Marital Status: Single    Spouse Name: N/A    Number of Children: N/A  . Years of Education: N/A    Occupational History  . Not on file.   Social History Main Topics  . Smoking status: Former Games developer  . Smokeless tobacco: Former Neurosurgeon    Quit date: 02/03/2012  . Alcohol Use: No  . Drug Use: No  . Sexually Active:    Other Topics Concern  . Not on file   Social History Narrative  . No narrative on file    Family History  Problem Relation Age of Onset  . Hypertension Father     Current Outpatient Prescriptions  Medication Sig Dispense Refill  . mercaptopurine (PURINETHOL) 50 MG tablet Take 50 mg by mouth daily. Give on an empty stomach 1 hour before or 2 hours after meals. Caution: Chemotherapy.      Marland Kitchen oxyCODONE-acetaminophen (PERCOCET) 5-325 MG per tablet Take 1 tablet by mouth every 4 (four) hours as needed. For pain      . predniSONE (DELTASONE) 20 MG tablet Take 20 mg by mouth daily.      . promethazine (PHENERGAN) 25 MG tablet Take 25 mg by mouth every 8 (eight) hours as needed. For nausea         No Known Allergies  BP 126/78  Pulse 73  Temp(Src) 99 F (37.2 C) (Temporal)  Ht 5' 9.75" (1.772 m)  Wt 123 lb 3.2 oz (55.883 kg)  BMI 17.80 kg/m2  SpO2 98%  Dg Small Bowel  03/25/2012  *RADIOLOGY REPORT*  Clinical  Data:  Crohn disease, prior ileocolonic resection, abdominal pain, nausea/vomiting  SMALL BOWEL SERIES  Technique:  Following ingestion of a mixture of thin barium and Entero Vu, serial small bowel images were obtained including spot views of the terminal ileum.  Fluoroscopy time:  1.1 minutes.  Comparison:  CT abdomen pelvis dated 02/02/2012  Findings:  Scout radiograph demonstrates suture lines in the right mid abdomen from prior ileocolonic resection.  Delayed small bowel transit, taking approximately 4 hours to get from the proximal duodenum to proximal colon.  Mildly prominent jejunal loops in the left mid abdomen.  Dilated loops of ileum in the right mid and lower abdomen.  No definite mucosal irregularity to suggest active inflammation.  No fistula is seen.   Marked narrowing at the ileocolonic anastomosis.  Visualized partially opacified ascending/transverse colon is decompressed.  IMPRESSION: Findings compatible with partial small bowel obstruction secondary to fixed narrowing/stricture at the ileocolonic anastomosis.  No definite mucosal irregularity to suggest active inflammation. No fistula is seen.  Original Report Authenticated By: Charline Bills, M.D.   Dg Abd Acute W/chest  04/02/2012  *RADIOLOGY REPORT*  Clinical Data: Luminal pain, vomiting, history of Crohn's disease  ACUTE ABDOMEN SERIES (ABDOMEN 2 VIEW & CHEST 1 VIEW)  Comparison: 03/25/2012  Findings: Cardiomediastinal silhouette is unremarkable.  No acute infiltrate or pleural effusion.  No pulmonary edema.  No free abdominal air.  Stable postsurgical changes right mid abdomen.  Minimal distended small bowel loops lower abdomen suspicious for ileus or early partial bowel obstruction.  IMPRESSION: No acute disease within chest.  Minimal distended small bowel loops in the lower abdomen suspicious for ileus or early partial bowel obstruction.  Original Report Authenticated By: Natasha Mead, M.D.     Review of Systems  Constitutional: Positive for unexpected weight change. Negative for fever, chills and diaphoresis.  HENT: Negative for nosebleeds, sore throat, facial swelling, mouth sores, trouble swallowing, neck pain and ear discharge.   Eyes: Negative for photophobia, discharge and visual disturbance.  Respiratory: Negative for choking, chest tightness, shortness of breath and stridor.   Cardiovascular: Negative for chest pain and palpitations.  Gastrointestinal: Positive for nausea, vomiting, abdominal pain and abdominal distention. Negative for diarrhea, constipation, blood in stool, anal bleeding and rectal pain.  Genitourinary: Negative for dysuria, urgency, difficulty urinating and testicular pain.  Musculoskeletal: Negative for myalgias, back pain, arthralgias and gait problem.  Skin:  Negative for color change, pallor, rash and wound.  Neurological: Negative for dizziness, speech difficulty, weakness, numbness and headaches.  Hematological: Negative for adenopathy. Does not bruise/bleed easily.  Psychiatric/Behavioral: Negative for hallucinations, confusion and agitation.       Objective:   Physical Exam  Constitutional: He is oriented to person, place, and time. He appears well-developed. He appears cachectic.  Non-toxic appearance. He does not have a sickly appearance. No distress.  HENT:  Head: Normocephalic.  Mouth/Throat: Oropharynx is clear and moist. No oropharyngeal exudate.  Eyes: Conjunctivae and EOM are normal. Pupils are equal, round, and reactive to light. No scleral icterus.  Neck: Normal range of motion. Neck supple. No tracheal deviation present.  Cardiovascular: Normal rate, regular rhythm and intact distal pulses.   Pulmonary/Chest: Effort normal and breath sounds normal. No respiratory distress.  Abdominal: Soft. Bowel sounds are normal. He exhibits no distension. There is tenderness in the right lower quadrant. There is no rigidity, no guarding and no CVA tenderness. No hernia. Hernia confirmed negative in the right inguinal area and confirmed negative in the left inguinal area.  Musculoskeletal: Normal range of motion. He exhibits no tenderness.  Lymphadenopathy:    He has no cervical adenopathy.       Right: No inguinal adenopathy present.       Left: No inguinal adenopathy present.  Neurological: He is alert and oriented to person, place, and time. No cranial nerve deficit. He exhibits normal muscle tone. Coordination normal.  Skin: Skin is warm and dry. No rash noted. He is not diaphoretic. No erythema. No pallor.  Psychiatric: He has a normal mood and affect. His behavior is normal. Judgment and thought content normal.       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 I agree with Doctor Elnoria Howard that trying to do dilation at the anastomosis is risky. Of course, surgery in an immunosuppressed patient with prior operations and Crohn's has risks as  well. I did offer them for a second opinion at a major academic center such as [facility] to see what other options are available.   His options are limited and I think he requires reoperation. I'll try this laparoscopically. However, with his history of keloid formation and prior abdominal surgeries x2, however a low threshold to convert to an open resection. His mother and father agree.  The anatomy & physiology of the digestive tract was discussed.  The pathophysiology was discussed.  Natural history risks without surgery was discussed.   I feel the risks of no intervention will lead to serious problems that outweigh the operative risks; therefore, I recommended a partial colectomy to remove the pathology.  Laparoscopic & open techniques were discussed.   Risks such as bleeding, infection, abscess, leak, reoperation, possible ostomy, hernia, heart attack, death, and other risks were discussed.  I noted a good likelihood this will help address the problem.   Goals of post-operative recovery were discussed as well.  We will work to minimize complications.  An educational handout on the pathology was given as well.  Questions were answered.  The patient expresses understanding & wishes to proceed with surgery.

## 2012-04-20 NOTE — Discharge Instructions (Signed)

## 2012-04-20 NOTE — Anesthesia Postprocedure Evaluation (Signed)
  Anesthesia Post-op Note  Patient: Nathan Lamb.  Procedure(s) Performed: Procedure(s) (LRB): COLON RESECTION LAPAROSCOPIC (N/A) LAPAROSCOPIC LYSIS OF ADHESIONS (N/A)  Patient Location: PACU  Anesthesia Type: General  Level of Consciousness: awake  Airway and Oxygen Therapy: Patient Spontanous Breathing  Post-op Pain: mild  Post-op Assessment: Post-op Vital signs reviewed  Post-op Vital Signs: Reviewed  Complications: No apparent anesthesia complications

## 2012-04-20 NOTE — Anesthesia Preprocedure Evaluation (Addendum)
Anesthesia Evaluation  Patient identified by MRN, date of birth, ID band Patient awake    Airway       Dental  (+) Teeth Intact and Dental Advisory Given   Pulmonary neg pulmonary ROS,          Cardiovascular     Neuro/Psych    GI/Hepatic Neg liver ROS, GERD-  ,  Endo/Other  negative endocrine ROS  Renal/GU negative Renal ROS     Musculoskeletal   Abdominal   Peds  Hematology   Anesthesia Other Findings   Reproductive/Obstetrics                          Anesthesia Physical Anesthesia Plan  ASA: III  Anesthesia Plan: General   Post-op Pain Management:    Induction: Intravenous  Airway Management Planned: Oral ETT  Additional Equipment:   Intra-op Plan:   Post-operative Plan: Possible Post-op intubation/ventilation  Informed Consent:   Plan Discussed with: CRNA  Anesthesia Plan Comments:         Anesthesia Quick Evaluation

## 2012-04-20 NOTE — Progress Notes (Signed)
Report given to stephanie rn as caregiver 

## 2012-04-20 NOTE — Op Note (Signed)
04/20/2012  11:57 AM  PATIENT:  Nathan Lamb  21 y.o. male  Patient Care Team: Margaree Mackintosh, MD as PCP - General (Internal Medicine) Theda Belfast, MD as Consulting Physician (Gastroenterology)  PRE-OPERATIVE DIAGNOSIS:   Ileocolonic anastomotic stricture Crohn's disease  POST-OPERATIVE DIAGNOSIS:  Ileocolonic anastomotic stricture Crohn's disease  PROCEDURE:  Procedure(s): COLON RESECTION LAPAROSCOPIC LAPAROSCOPIC LYSIS OF ADHESIONS x62min SURGEON:  Surgeon(s): Ardeth Sportsman, MD  ASSISTANT: none   ANESTHESIA:   local and general  EBL:  Total I/O In: 1800 [I.V.:1300; IV Piggyback:500] Out: 250 [Urine:200; Blood:50]  Delay start of Pharmacological VTE agent (>24hrs) due to surgical blood loss or risk of bleeding:  no  DRAINS: none   SPECIMEN:  Source of Specimen:  1.  Ileocolinc anastamosis with stricture.  2. ileal mesentery mass (?LN)  DISPOSITION OF SPECIMEN:  PATHOLOGY  COUNTS:  YES  PLAN OF CARE: Admit to inpatient   PATIENT DISPOSITION:  PACU - hemodynamically stable.  INDICATION:   This is a pleasant young male with a history of Crohn's disease. He presented with perforation and required emergency surgery and ileostomy age 68. I did a lap-assisted ileostomy takedown at age 30 for him. He has had progressive weight loss and intermittent abdominal pain. Was found to have a stricture near his anastomosis. Not felt to be a good candidate for dilation. Had not been responsive to steroids and antibiotic regimens.  Recommended consideration of resection of the stricture since medical and immunosuppressive options have been exhausted. I discussed the procedure with he and his parents:  The anatomy & physiology of the digestive tract was discussed.  The pathophysiology was discussed.  Natural history risks without surgery was discussed.   I feel the risks of no intervention will lead to serious problems that outweigh the operative risks; therefore, I  recommended a partial colectomy to remove the pathology.  Laparoscopic & open techniques were discussed.   Risks such as bleeding, infection, abscess, leak, reoperation, possible ostomy, hernia, heart attack, death, and other risks were discussed.  I noted a good likelihood this will help address the problem.   Goals of post-operative recovery were discussed as well.  We will work to minimize complications.  An educational handout on the pathology was given as well.  Questions were answered.  The patient expresses understanding & wishes to proceed with surgery.  OR FINDINGS:   He had no adhesions to the anterior abdominal wall. He did have a dense inflammatory mass adherent to the right flank and upper quadrant at his prior ileocolonic anastomosis. He had ileal bowel loops adherent to this inflammatory region. No discrete cancer or tumor. No metastatic disease. No evidence of fistula or perforation  DESCRIPTION:   Informed consent was confirmed.  The patient underwent general anaesthesia without difficulty.  The patient was positioned appropriately.  VTE prevention in place.  The patient's abdomen was clipped, prepped, & draped in a sterile fashion.  Surgical timeout confirmed our plan.  The patient was positioned in reverse Trendelenburg.  Abdominal entry was gained using optical entry technique in the left upper abdomen.  Entry was clean.  I induced carbon dioxide insufflation.  Camera inspection revealed no injury.  Extra ports were carefully placed under direct laparoscopic visualization.  There were no adhesions to the intra-abdominal wall. He did have some adhesions of small bowel to the pelvis and freed this off using cold scissors. He did adhesions of small bowel to not inflammatory mass in the right flank and  upper quadrant. Carefully freed off the using laparoscopic cold scissors aware of the areas were thin.  I decided to mobilize the ileocolonic anastomosis right colon. He had dense  thickness  To the anterior bowel and sidewall.was hard to see a good plane. I therefore decided to mobilize in a superior-inferior fashion. I found the transverse colon elevated that. I swept the right colon mesentery off the retroperitoneum. I freed adhesions off the gallbladder and liver. I mobilized the neo-hepatic flexure. I it was able to come posterior to the inflammatory mass.  I then came from the inferior aspect. I was able to elevate the neo-terminal ileal mesentery. I freed from its attachments off the right pelvic brim and paracolic gutter. I was able to see the right ureter and right gonadal vessels and keep those posteriorly. I swept the ileal mesentery and right colon mesentery off the retroperitoneum. I found a duodenal sweep and is able to sleep that off that as well.  With that I could finally free off the attachments of the inflamed anastomosis to the abdominal wall and right flank sidewall. I and up having to go into the preperitoneal space to get into a noninflamed plane.  With that, I finally had relieved all attachments of the ileum and transverse colon. The whole right side can be mobilized.  Note that the lysis of adhesions to 90 minutes.   I placed a GelPort through a periumbilical midline 8 centimeter incision. With that I could eviscerate the ileocolonic anastomosis. It was  enlarged but I could fit through the wound protector.   freed off the small bowel loops off the anastomosis using sharp scissors and focused cautery. The foot of ileum proximal anastomosis was thickened and dilated.  I did not see any definite creeping fat. The mesentery there was inflamed. I did an ileocolonic anastomosis using a 75 GIA stapler in a side-to-side fashion. I trimmed off the common defect using a TA 90 stapler. I took the mesentery in a radial fashion using the bipolar En Seal system along with 2-0 Vicryl at the vascular pedicle at the anastomosis. I stayed away from the duodenum and  retroperitoneum. Had good blood supply. This help remove the inflammatory mesentery. There was an inflammatory ellipsoid mass consistent with a large lymph node that I carefully freed off as well. I closed off the mesenteric defect using interrupted Vicryl stitches. I closed it in a T fashion such that the distal mesentery was closed transversely to cover up the exposed TA 90 staple line. I placed a stitch at the crotch of the anastomosis on the proximal and. Anastomosis is wide open and patent. Certainly larger than the lumen of the ileum.  I did copious irrigation with 5 L of saline. Hemostasis is good. I did a final irrigation of clindamycin 900 mg plus gentamicin 240 mg in a liter saline. I let that sit for several minutes. I did diagnostic laparoscopy. There were a few small oozers off the right anterior abdominal wall and flank that I controlled with focused that point cautery. Ureter and gonadal is were intact.  I aspirated the antibiotic solution.  I ran the small bowel and saw no serosal injury or tear in an open fashion & laid the anastomosis lay down. I placed On-Q catheters in the preperitoneal plane. I closed the fascia using #1 PDS. I closed skin using 4 Monocryl stitch. I did place some corners of gauze into the midline wound. Very thin abdominal wall. Cervix is applied. His x-rays  and covered stated issue.  About to locate family and discuss postoperative care with him. her plane. With bag to fully mobilize

## 2012-04-20 NOTE — Transfer of Care (Signed)
Immediate Anesthesia Transfer of Care Note  Patient: Nathan Lamb.  Procedure(s) Performed: Procedure(s) (LRB): COLON RESECTION LAPAROSCOPIC (N/A) LAPAROSCOPIC LYSIS OF ADHESIONS (N/A)  Patient Location: PACU  Anesthesia Type: General  Level of Consciousness: awake, alert , oriented and sedated  Airway & Oxygen Therapy: Patient Spontanous Breathing  Post-op Assessment: Report given to PACU RN, Post -op Vital signs reviewed and stable and Patient moving all extremities    Post vital signs: Reviewed and stable  Complications: No apparent anesthesia complications

## 2012-04-20 NOTE — Interval H&P Note (Signed)
History and Physical Interval Note:  04/20/2012 8:25 AM  Nathan Lamb.  has presented today for surgery, with the diagnosis of anastomotic stricture chrohn's disease  The various methods of treatment have been discussed with the patient and family. After consideration of risks, benefits and other options for treatment, the patient has consented to  Procedure(s) (LRB): ILEOCOLON RESECTION LAPAROSCOPIC (N/A) as a surgical intervention .  The patients' history has been reviewed, patient examined, no change in status, stable for surgery.  I have reviewed the patients' chart and labs.  Questions were answered to the patient's satisfaction.     Nathan Lamb C.

## 2012-04-20 NOTE — Preoperative (Signed)
Beta Blockers   Reason not to administer Beta Blockers:Not Applicable 

## 2012-04-21 ENCOUNTER — Encounter (HOSPITAL_COMMUNITY): Payer: Self-pay | Admitting: Surgery

## 2012-04-21 LAB — CBC
HCT: 30.2 % — ABNORMAL LOW (ref 39.0–52.0)
Hemoglobin: 10.7 g/dL — ABNORMAL LOW (ref 13.0–17.0)
WBC: 14.1 10*3/uL — ABNORMAL HIGH (ref 4.0–10.5)

## 2012-04-21 LAB — BASIC METABOLIC PANEL
BUN: 6 mg/dL (ref 6–23)
Chloride: 98 mEq/L (ref 96–112)
GFR calc Af Amer: >90 mL/min (ref >90)
Potassium: 4.2 mEq/L (ref 3.5–5.1)

## 2012-04-21 MED ORDER — CHLORHEXIDINE GLUCONATE 0.12 % MT SOLN
15.0000 mL | Freq: Two times a day (BID) | OROMUCOSAL | Status: DC
  Administered 2012-04-21 – 2012-04-22 (×3): 15 mL via OROMUCOSAL
  Filled 2012-04-21 (×4): qty 15

## 2012-04-21 MED ORDER — BIOTENE DRY MOUTH MT LIQD
15.0000 mL | Freq: Two times a day (BID) | OROMUCOSAL | Status: DC
  Administered 2012-04-22 (×2): 15 mL via OROMUCOSAL

## 2012-04-21 NOTE — Progress Notes (Signed)
Nathan Lamb [id number] Aug 30, 1991  CARE TEAM:  PCP: Margaree Mackintosh, MD, MD  Outpatient Care Team: Patient Care Team: Margaree Mackintosh, MD as PCP - General (Internal Medicine) Theda Belfast, MD as Consulting Physician (Gastroenterology)  Inpatient Treatment Team: Treatment Team: Attending Provider: Ardeth Sportsman, MD; Registered Nurse: Augustin Coupe, RN  Subjective:  No major events Walking in hallways  Objective:  Vital signs:  Filed Vitals:   04/20/12 2205 04/21/12 0224 04/21/12 0535 04/21/12 0537  BP: 113/55 107/66 113/62   Pulse: 74 64 66   Temp: 98.5 F (36.9 C) 98.4 F (36.9 C) 98.7 F (37.1 C)   TempSrc:  Oral Oral   Resp: 17 17 18    Height:    5\' 9"  (1.753 m)  Weight:    126 lb (57.153 kg)  SpO2: 100% 97% 100%     Last BM Date: 04/17/12  Intake/Output   Yesterday:  05/29 0701 - 05/30 0700 In: 4250.9 [P.O.:480; I.V.:3270.9; IV Piggyback:500] Out: 4860 [Urine:4800; Blood:60] This shift:     Bowel function:  Flatus: n  BM: n   Results:   Labs: Results for orders placed during the hospital encounter of 04/20/12 (from the past 48 hour(s))  CBC     Status: Abnormal   Collection Time   04/20/12  4:52 PM      Component Value Range Comment   WBC 12.0 (*) 4.0 - 10.5 (K/uL)    RBC 3.02 (*) 4.22 - 5.81 (MIL/uL)    Hemoglobin 10.4 (*) 13.0 - 17.0 (g/dL)    HCT 30.8 (*) 65.7 - 52.0 (%)    MCV 95.0  78.0 - 100.0 (fL)    MCH 34.4 (*) 26.0 - 34.0 (pg)    MCHC 36.2 (*) 30.0 - 36.0 (g/dL)    RDW 84.6 (*) 96.2 - 15.5 (%)    Platelets 241  150 - 400 (K/uL)   CREATININE, SERUM     Status: Normal   Collection Time   04/20/12  4:52 PM      Component Value Range Comment   Creatinine, Ser 0.74  0.50 - 1.35 (mg/dL)    GFR calc non Af Amer >90  >90 (mL/min)    GFR calc Af Amer >90  >90 (mL/min)   BASIC METABOLIC PANEL     Status: Abnormal   Collection Time   04/21/12  6:15 AM      Component Value Range Comment   Sodium 138  135 - 145 (mEq/L)    Potassium 4.2  3.5 - 5.1 (mEq/L)    Chloride 98  96 - 112 (mEq/L)    CO2 27  19 - 32 (mEq/L)    Glucose, Bld 107 (*) 70 - 99 (mg/dL)    BUN 6  6 - 23 (mg/dL)    Creatinine, Ser 9.52  0.50 - 1.35 (mg/dL)    Calcium 9.9  8.4 - 10.5 (mg/dL)    GFR calc non Af Amer >90  >90 (mL/min)    GFR calc Af Amer >90  >90 (mL/min)   CBC     Status: Abnormal   Collection Time   04/21/12  6:15 AM      Component Value Range Comment   WBC 14.1 (*) 4.0 - 10.5 (K/uL)    RBC 3.13 (*) 4.22 - 5.81 (MIL/uL)    Hemoglobin 10.7 (*) 13.0 - 17.0 (g/dL)    HCT 84.1 (*) 32.4 - 52.0 (%)    MCV 96.5  78.0 - 100.0 (fL)  MCH 34.2 (*) 26.0 - 34.0 (pg)    MCHC 35.4  30.0 - 36.0 (g/dL)    RDW 16.1 (*) 09.6 - 15.5 (%)    Platelets 312  150 - 400 (K/uL)     Imaging / Studies: No results found.  Medications / Allergies: per chart  Antibiotics: Anti-infectives     Start     Dose/Rate Route Frequency Ordered Stop   04/20/12 1000   gentamicin (GARAMYCIN) 240 mg, clindamycin (CLEOCIN) 900 mg in sodium chloride irrigation 0.9 % 1,000 mL irrigation         Irrigation To Surgery 04/20/12 0949 04/20/12 1036   04/19/12 1419   cefOXitin (MEFOXIN) 2 g in dextrose 5 % 50 mL IVPB        2 g 100 mL/hr over 30 Minutes Intravenous 60 min pre-op 04/19/12 1419 04/20/12 0850          Problem List:  Principal Problem:  *Stricture at ileocolonic anastomosis Active Problems:  Crohn's disease   Assessment  Nathan Lamb.  21 y.o. male  1 Day Post-Op  Procedure(s): COLON RESECTION LAPAROSCOPIC LAPAROSCOPIC LYSIS OF ADHESIONS  Stable  Plan:  -anti ileus protocol -continue walking -adv diet slowly -f/u path -baseline steroid immunosuppression - ?`taper post-op? - Defer to GI  -VTE prophylaxis- SCDs, etc -mobilize as tolerated to help recovery  Ardeth Sportsman, M.D., F.A.C.S. Gastrointestinal and Minimally Invasive Surgery [facility] [address] [place], [place]  [address] [phone] Main / Paging [phone] Voice Mail   04/21/2012

## 2012-04-21 NOTE — Progress Notes (Signed)
INITIAL ADULT NUTRITION ASSESSMENT Date: 04/21/2012   Time: 3:00 PM Reason for Assessment: nutrition risk; wt loss  ASSESSMENT: Male 21 y.o.  Dx: Stricture intestinal  Hx:  Past Medical History  Diagnosis Date  . Crohn's disease 09-10-2006    Confirmed by Colonoscopy  . Thyroid nodule     watching by ultrasound  . Seasonal allergies   . GERD (gastroesophageal reflux disease)     doesn't take any meds at present time   Past Surgical History  Procedure Date  . 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 Colonoscopy   . Laparoscopic colon resection 04/20/12    w/lysis of adhesions  . Appendectomy 2007  . Creation / revision of ileostomy / jejunostomy 10/2006    "for bowel obstruction that perforated"  . Resection small bowel / closure ileostomy 02/2007  . Colon resection 04/20/2012    Procedure: COLON RESECTION LAPAROSCOPIC;  Surgeon: Ardeth Sportsman, MD;  Location: [facility] OR;  Service: General;  Laterality: N/A;  laparoscopic assisted resection of stricture at old ileocolonic anastomosis    Related Meds:  Scheduled Meds:   . acetaminophen  650 mg Oral QID  . antiseptic oral rinse  15 mL Mouth Rinse q12n4p  . chlorhexidine  15 mL Mouth Rinse BID  . heparin  5,000 Units Subcutaneous Q8H  . HYDROmorphone      . lip balm  1 application Topical BID  . mercaptopurine  50 mg Oral Daily  . predniSONE  20 mg Oral Q breakfast  . psyllium  1 packet Oral BID  . saccharomyces boulardii  250 mg Oral BID   Continuous Infusions:   . lactated ringers 75 mL/hr at 04/21/12 1357   PRN Meds:.alum & mag hydroxide-simeth, diphenhydrAMINE, HYDROmorphone (DILAUDID) injection, lactated ringers, magic mouthwash, ondansetron (ZOFRAN) IV, promethazine   Ht: 5\' 9"  (175.3 cm)  Wt: 126 lb (57.153 kg)  Ideal Wt: 72.7 kg % Ideal Wt: 78%  Usual Wt: 65.9 kg % Usual Wt: 86%  Body mass index is 18.61 kg/(m^2).  Food/Nutrition Related Hx: Pt with complications from Crohn's that initiated in February  with nausea, vomiting, abdominal pain and wt loss.  Pt states he has never had a flare-up of Crohn's.    Labs:  CMP     Component Value Date/Time   NA 138 04/21/2012 0615   K 4.2 04/21/2012 0615   CL 98 04/21/2012 0615   CO2 27 04/21/2012 0615   GLUCOSE 107* 04/21/2012 0615   BUN 6 04/21/2012 0615   CREATININE 0.71 04/21/2012 0615   CALCIUM 9.9 04/21/2012 0615   PROT 6.4 04/02/2012 0939   ALBUMIN 4.2 04/02/2012 0939   AST 14 04/02/2012 0939   ALT 11 04/02/2012 0939   ALKPHOS 44 04/02/2012 0939   BILITOT 1.7* 04/02/2012 0939   GFRNONAA >90 04/21/2012 0615   GFRAA >90 04/21/2012 0615    CBC    Component Value Date/Time   WBC 14.1* 04/21/2012 0615   RBC 3.13* 04/21/2012 0615   HGB 10.7* 04/21/2012 0615   HCT 30.2* 04/21/2012 0615   PLT 312 04/21/2012 0615   MCV 96.5 04/21/2012 0615   MCH 34.2* 04/21/2012 0615   MCHC 35.4 04/21/2012 0615   RDW 20.7* 04/21/2012 0615   LYMPHSABS 0.7 04/02/2012 0939   MONOABS 0.8 04/02/2012 0939   EOSABS 0.0 04/02/2012 0939   BASOSABS 0.0 04/02/2012 0939    Intake: 50% with snacks Output:   Intake/Output Summary (Last 24 hours) at 04/21/12 1507 Last data filed at 04/21/12 1400  Gross per 24 hour  Intake 2615.93 ml  Output   4250 ml  Net -1634.07 ml    Diet Order: Clear Liquid  Supplements/Tube Feeding: None at this time  IVF:    lactated ringers Last Rate: 75 mL/hr at 04/21/12 1357    Estimated Nutritional Needs:   Kcal: 2000-2300  Increased needs to promote wt gain Protein: 85-114g Fluid: >1.7 L/day  Pt admitted with nausea, vomiting, abdominal pain, and wt loss over the past several months r/t stricture.  Pt with h/o Crohn's disease reports he has never had a flare since dx at age 31 yrs.  Pt reports his usual weight to be 145 lbs.  He has lost 13% of his usual weight in 3 months with loss of subcuatenous fat and muscle mass.  Pt with apparent wasting at clavicles.  Pt qualifies for severe malnutrition of chronic illness.  He is currently on clear  liquids and tolerating well.  Pt request extra jello and no broth.    NUTRITION DIAGNOSIS: -Unintended wt loss (NI-2.1).  Status: Ongoing  RELATED TO: nausea, vomiting, abdominal pain  AS EVIDENCE BY: pt with 13% wt loss in 3 months  MONITORING/EVALUATION(Goals): 1.  Food/Beverage; diet advancement with tolerance  2.  Gastrointestinal; resolve of nausea, vomiting 3.  Wt/wt change; deter further loss, promote wt gain  EDUCATION NEEDS: -Education needs addressed with pt and mother  INTERVENTION: 1.  Modify diet; diet advancement to Low Fiber goal or per MD discretion.  RD added pt food preferences to healthtouch. 2.  Brief education; pt requested handouts on low residue and mechanical soft nutrition therapy which are provided.  Encouraged diet per MD discretion.  Dietitian #: [phone]  DOCUMENTATION CODES Per approved criteria  -Severe malnutrition in the context of chronic illness    Loyce Dys [facility] 04/21/2012, 3:00 PM

## 2012-04-22 MED ORDER — MORPHINE SULFATE 2 MG/ML IJ SOLN
2.0000 mg | INTRAMUSCULAR | Status: DC | PRN
  Administered 2012-04-22 – 2012-04-23 (×2): 4 mg via INTRAVENOUS
  Filled 2012-04-22 (×2): qty 2

## 2012-04-22 MED ORDER — OXYCODONE HCL 5 MG PO TABS
5.0000 mg | ORAL_TABLET | ORAL | Status: DC | PRN
  Administered 2012-04-22 – 2012-04-24 (×8): 10 mg via ORAL
  Filled 2012-04-22 (×8): qty 2

## 2012-04-22 NOTE — Progress Notes (Signed)
Nathan Lamb [id number] 06/28/1991  CARE TEAM:  PCP: Margaree Mackintosh, MD, MD  Outpatient Care Team: Patient Care Team: Margaree Mackintosh, MD as PCP - General (Internal Medicine) Theda Belfast, MD as Consulting Physician (Gastroenterology)  Inpatient Treatment Team: Treatment Team: Attending Provider: Ardeth Sportsman, MD; Registered Nurse: Augustin Coupe, RN; Registered Nurse: Eliezer Bottom, RN; Attending Physician: Theda Belfast, MD; Technician: Lowell Bouton, NT  Subjective:  No major events Walking in hallways Itching with Dilaudid Mother in room  Objective:  Vital signs:  Filed Vitals:   04/21/12 1425 04/21/12 1721 04/21/12 2204 04/22/12 0540  BP: 120/77 120/73 120/66 117/79  Pulse: 68 66 62 70  Temp: 98.4 F (36.9 C) 97.9 F (36.6 C) 98.3 F (36.8 C) 98.6 F (37 C)  TempSrc:      Resp: 18 20 15 16   Height:      Weight:      SpO2: 100% 100% 100% 99%    Last BM Date: 04/17/12  Intake/Output   Yesterday:  05/30 0701 - 05/31 0700 In: 2007.7 [P.O.:240; I.V.:1142.7; IV Piggyback:625] Out: 2100 [Urine:2100] This shift:  Total I/O In: 1142.7 [I.V.:1142.7] Out: 500 [Urine:500]  Bowel function:  Flatus: n  BM: n  General: Pt awake/alert/oriented x4 in no major acute distress Eyes: PERRL, normal EOM. Sclera nonicteric Neuro: CN II-XII intact w/o focal sensory/motor deficits. Lymph: No head/neck/groin lymphadenopathy Psych:  No delerium/psychosis/paranoia HENT: Normocephalic, Mucus membranes moist.  No thrush Neck: Supple, No tracheal deviation Chest: No pain.  Good respiratory excursion. CV:  Pulses intact.  Regular rhythm Abdomen: Flat, Nondistended.  Mildly tender at incision.  No incarcerated hernias. Ext:  SCDs BLE.  No significant edema.  No cyanosis Skin: No petechiae / purpurae   Results:   Labs: Results for orders placed during the hospital encounter of 04/20/12 (from the past 48 hour(s))  CBC     Status: Abnormal   Collection Time   04/20/12  4:52 PM      Component Value Range Comment   WBC 12.0 (*) 4.0 - 10.5 (K/uL)    RBC 3.02 (*) 4.22 - 5.81 (MIL/uL)    Hemoglobin 10.4 (*) 13.0 - 17.0 (g/dL)    HCT 40.9 (*) 81.1 - 52.0 (%)    MCV 95.0  78.0 - 100.0 (fL)    MCH 34.4 (*) 26.0 - 34.0 (pg)    MCHC 36.2 (*) 30.0 - 36.0 (g/dL)    RDW 91.4 (*) 78.2 - 15.5 (%)    Platelets 241  150 - 400 (K/uL)   CREATININE, SERUM     Status: Normal   Collection Time   04/20/12  4:52 PM      Component Value Range Comment   Creatinine, Ser 0.74  0.50 - 1.35 (mg/dL)    GFR calc non Af Amer >90  >90 (mL/min)    GFR calc Af Amer >90  >90 (mL/min)   BASIC METABOLIC PANEL     Status: Abnormal   Collection Time   04/21/12  6:15 AM      Component Value Range Comment   Sodium 138  135 - 145 (mEq/L)    Potassium 4.2  3.5 - 5.1 (mEq/L)    Chloride 98  96 - 112 (mEq/L)    CO2 27  19 - 32 (mEq/L)    Glucose, Bld 107 (*) 70 - 99 (mg/dL)    BUN 6  6 - 23 (mg/dL)    Creatinine, Ser 9.56  0.50 -  1.35 (mg/dL)    Calcium 9.9  8.4 - 10.5 (mg/dL)    GFR calc non Af Amer >90  >90 (mL/min)    GFR calc Af Amer >90  >90 (mL/min)   CBC     Status: Abnormal   Collection Time   04/21/12  6:15 AM      Component Value Range Comment   WBC 14.1 (*) 4.0 - 10.5 (K/uL)    RBC 3.13 (*) 4.22 - 5.81 (MIL/uL)    Hemoglobin 10.7 (*) 13.0 - 17.0 (g/dL)    HCT 16.1 (*) 09.6 - 52.0 (%)    MCV 96.5  78.0 - 100.0 (fL)    MCH 34.2 (*) 26.0 - 34.0 (pg)    MCHC 35.4  30.0 - 36.0 (g/dL)    RDW 04.5 (*) 40.9 - 15.5 (%)    Platelets 312  150 - 400 (K/uL)     Imaging / Studies: No results found.  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  21 y.o. male  2 Days Post-Op  Procedure(s): COLON RESECTION LAPAROSCOPIC LAPAROSCOPIC LYSIS OF ADHESIONS  Stable  Plan:  -anti ileus protocol -continue walking -adv diet slowly -taper IVF -f/u path -baseline steroid immunosuppression - ?`taper post-op? - Defer to GI  -VTE prophylaxis- SCDs, etc -mobilize as tolerated to help recovery  Ardeth Sportsman, M.D., F.A.C.S. Gastrointestinal and Minimally Invasive Surgery [facility] [address] [place], [place] [address] [phone] Main / Paging [phone] Voice Mail   04/22/2012

## 2012-04-23 MED ORDER — BISACODYL 10 MG RE SUPP
10.0000 mg | Freq: Once | RECTAL | Status: AC
  Administered 2012-04-23: 10 mg via RECTAL
  Filled 2012-04-23: qty 1

## 2012-04-23 NOTE — Progress Notes (Signed)
3 Days Post-Op  Subjective: No flatus or stool yet but seems to be tolerating diet. Pain control good. Vital signs looked good.  On mercaptopurine, prednisone 20 mg daily, and florastor.  Objective: Vital signs in last 24 hours: Temp:  [98.7 F (37.1 C)-99 F (37.2 C)] 98.7 F (37.1 C) (06/01 0606) Pulse Rate:  [67-74] 67  (06/01 0606) Resp:  [16-17] 16  (06/01 0606) BP: (117-120)/(67-72) 119/67 mmHg (06/01 0606) SpO2:  [100 %] 100 % (06/01 0606) Last BM Date: 04/17/12  Intake/Output from previous day: 05/31 0701 - 06/01 0700 In: 1280.6 [P.O.:960; I.V.:320.6] Out: 2150 [Urine:2150] Intake/Output this shift:    General appearance: alert and stable. In no distress. Mental status normal. Abdomen: Soft. Nondistended. Wounds clean. On-Q  device in place.  Lab Results:  No results found for this or any previous visit (from the past 24 hour(s)).   Studies/Results: @[id number] @     . acetaminophen  650 mg Oral QID  . heparin  5,000 Units Subcutaneous Q8H  . lip balm  1 application Topical BID  . mercaptopurine  50 mg Oral Daily  . predniSONE  20 mg Oral Q breakfast  . psyllium  1 packet Oral BID  . saccharomyces boulardii  250 mg Oral BID  . DISCONTD: antiseptic oral rinse  15 mL Mouth Rinse q12n4p  . DISCONTD: chlorhexidine  15 mL Mouth Rinse BID     Assessment/Plan: s/p Procedure(s): COLON RESECTION LAPAROSCOPIC LAPAROSCOPIC LYSIS OF ADHESIONS  POD #3, redo ileocolic resection for recurrent Crohn's disease. Stable, but has not normalized his GI function yet Continue diet Increase activity Continue immunosuppression as is Saline lock IV dulcolax suppository Hopefully home tomorrow.    LOS: 3 days    Nathan Lamb M 04/23/2012  . .prob

## 2012-04-24 MED ORDER — HYDROCODONE-ACETAMINOPHEN 5-325 MG PO TABS: 1.0000 | ORAL_TABLET | ORAL | Status: AC | PRN

## 2012-04-24 NOTE — Discharge Summary (Addendum)
Patient ID: Nathan Lamb [id number] 21 y.o. 03-11-1991  04/20/2012  Discharge date and time: April 24, 2012  Admitting Physician: Nathan Lamb  Discharge Physician: Nathan Lamb  Admission Diagnoses: anastomotic stricture chrohn's disease  Discharge Diagnoses: Crohn's disease with anastomotic structure and partial obstruction  Operations: Procedure(s): COLON RESECTION LAPAROSCOPIC LAPAROSCOPIC LYSIS OF ADHESIONS  Admission Condition: good  Discharged Condition: good  Indication for Admission: This patient is a 21 y.o.male. Patient is a pleasant young male. He had a perforation in his mid teens. Required resection and ileostomy. Was found to have Crohn's. Was placed on immunosuppression. He was sent to me for ileostomy takedown. I did this 5 years ago. He has done well on 6-mercaptopurine. His mother works in the medical field and endoscopy.  A few months ago, he noticed crampy intermittent abdominal pain. CAT scan showed evidence of anastomotic stricturing. No inflammation. No fistula. Was placed on high dose steroids. Had some mild improvement but could not taper down. As low as 10 but back up to 20mg . He has lost 17 pounds over the past few months. Still with intermittent nausea and vomiting. He modified his diet.  Dr. Elnoria Lamb on endoscopy noted a stricture ~ 95%. He was hesitant to do any dilation due to risk of perforation since ileocolonic. He therefore sent the patient for evaluation to see if he would benefit from surgery  He was counseled as an outpatient. He is brought to the operating room electively.   Hospital Course: on the day of admission the patient was taken to the operating room and underwent a laparoscopic-assisted lysis of adhesions and resection of his strictured ileocolic anastomosis. Procedure did well. Pathology report is pending at the time of this dictation.  Over the last 4 days he has progressed slowly but steadily in his diet and activities.  He has progressed to the point where he is ready to go home today. He is tolerating a regular diet. He has had bowel movements and flatus. Nominal exam reveals a no distention and minimal tenderness. His on cue pump is removed.  He was instructed to continue all his immunosuppressive medication exactly as prescribed by Dr. Elnoria Lamb, and to contact Dr. Elnoria Lamb next week to discuss if any modifications necessary. He was given a prescription for Vicodin.  He will call the office Monday to make up with Dr. Michaell Lamb in about 2 weeks.  Consults: None  Significant Diagnostic Studies: pathology (pending)  Treatments: surgery: laparoscopic-assisted resection of ileocolic anastomotic stricture.  Disposition: Home  Patient Instructions:   Nathan Lamb.  Home Medication Instructions [id number]   Printed on:04/24/12 0904  Medication Information                    mercaptopurine (PURINETHOL) 50 MG tablet Take 50 mg by mouth daily. Give on an empty stomach 1 hour before or 2 hours after meals. Caution: Chemotherapy.           predniSONE (DELTASONE) 20 MG tablet Take 20 mg by mouth daily.           promethazine (PHENERGAN) 25 MG tablet Take 25 mg by mouth every 8 (eight) hours as needed. For nausea            oxyCODONE-acetaminophen (PERCOCET) 5-325 MG per tablet Take 1 tablet by mouth every 4 (four) hours as needed. For pain             Activity: no lifting, driving, or strenuous exercise for 5 weeks Diet: low  fat, low cholesterol diet Wound Care: none needed  Follow-up:  With Dr. Michaell Lamb in 2 weeks.  Signed: Angelia Mould. Derrell Lamb, M.D., FACS General and minimally invasive surgery Breast and Colorectal Surgery  04/24/2012, 9:04 AM

## 2012-04-24 NOTE — Progress Notes (Signed)
4 Days Post-Op  Subjective: Feels well. Tolerating regular diet. Passing flatus and had 3 bowel movements. Was to go home. Minimal pain.  Objective: Vital signs in last 24 hours: Temp:  [98.6 F (37 C)-99.1 F (37.3 C)] 98.9 F (37.2 C) (06/02 0548) Pulse Rate:  [70-82] 70  (06/02 0548) Resp:  [16-18] 18  (06/02 0548) BP: (113-116)/(70-73) 114/73 mmHg (06/02 0548) SpO2:  [99 %-100 %] 100 % (06/02 0548) Last BM Date: 04/23/12  Intake/Output from previous day: 06/01 0701 - 06/02 0700 In: 240 [P.O.:240] Out: 200 [Urine:200] Intake/Output this shift:    physical exam:  General: Alert. No status normal. No distress. Abdomen: Soft. Flat. Nontender. Midline wound okay. On-Q in place.  Lab Results:  No results found for this or any previous visit (from the past 24 hour(s)).   Studies/Results: @RISRSLT24 @     . acetaminophen  650 mg Oral QID  . bisacodyl  10 mg Rectal Once  . heparin  5,000 Units Subcutaneous Q8H  . lip balm  1 application Topical BID  . mercaptopurine  50 mg Oral Daily  . predniSONE  20 mg Oral Q breakfast  . psyllium  1 packet Oral BID  . saccharomyces boulardii  250 mg Oral BID     Assessment/Plan: s/p Procedure(s): COLON RESECTION LAPAROSCOPIC LAPAROSCOPIC LYSIS OF ADHESIONS   POD #4. Redo ileocolic resection for recurrent Crohn's disease. Ileus has resolved. Ready for discharge.  Diet and activities discussed. Prescription for Vicodin given. He will continue mercaptopurine, prednisone, and florastor.  He has PRESCRIPTION at home. He will contact Dr. Elnoria Howard next week to discuss medication management.  Return to see Dr. Michaell Cowing in 2 weeks.     LOS: 4 days    [facility] M. Derrell Lolling, M.D., [facility] General and Minimally invasive Surgery Breast and Colorectal Surgery Office:   [phone] Pager:   [phone]  04/24/2012  . .prob

## 2012-04-24 NOTE — Progress Notes (Signed)
Pt discharged to home accomp by family.  All discharge instructions reviewed with pt/family (see copy of instructions given to pt).  Rx given for Norco and explained.  OnQ pain control ball discontinued prior to discharge and sites were unremarkable, covered with 2x2's and clear dressing over each site.  Pt informed to keep those dressings on x 24 hrs.  Pt instructed to call for any fever greater than 100.4 F., any s/s infection (redness, swelling ,drainage with a foul odor), uncontrolled pain.  Pt verbalized understanding of all home care instructions and what number to call if he has any problems/concerns/questions.

## 2012-04-26 ENCOUNTER — Telehealth (INDEPENDENT_AMBULATORY_CARE_PROVIDER_SITE_OTHER): Payer: Self-pay | Admitting: General Surgery

## 2012-04-26 NOTE — Telephone Encounter (Signed)
Message copied by Liliana Cline on Tue Apr 26, 2012  9:02 AM ------      Message from: Ardeth Sportsman      Created: Tue Apr 26, 2012  8:17 AM       Tell pt the good news!  Path benign

## 2012-04-26 NOTE — Telephone Encounter (Signed)
Patient's mom made aware of path results. Will follow up at appt and call with any questions prior.

## 2012-05-16 ENCOUNTER — Encounter (INDEPENDENT_AMBULATORY_CARE_PROVIDER_SITE_OTHER): Payer: Self-pay | Admitting: Surgery

## 2012-05-16 ENCOUNTER — Ambulatory Visit (INDEPENDENT_AMBULATORY_CARE_PROVIDER_SITE_OTHER): Payer: BC Managed Care – PPO | Admitting: Surgery

## 2012-05-16 VITALS — BP 110/58 | HR 102 | Temp 98.7°F | Ht 69.5 in | Wt 138.8 lb

## 2012-05-16 DIAGNOSIS — K5000 Crohn's disease of small intestine without complications: Secondary | ICD-10-CM

## 2012-05-16 DIAGNOSIS — K5090 Crohn's disease, unspecified, without complications: Secondary | ICD-10-CM

## 2012-05-16 DIAGNOSIS — K50013 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: Secondary | ICD-10-CM

## 2012-05-16 NOTE — Progress Notes (Signed)
Subjective:     Patient ID: Nathan Lamb., male   DOB: [birth year], 21 y.o.   MRN: [id number]  HPI  Nathan Lamb  04/29/1991 [id number]  Patient Care Team: Margaree Mackintosh, MD as PCP - General (Internal Medicine) Theda Belfast, MD as Consulting Physician (Gastroenterology)  This patient is a 21 y.o.male who presents today for surgical evaluation.   Procedure: Lap assisted ileocolonic resection 04/04/2012  FINAL DIAGNOSIS Diagnosis 1. Colon, segmental resection, ileocolonic anastomosis - ACTIVE CHRONIC CROHN'S ENTEROCOLITIS. - PROXIMAL AND DISTAL MARGINS ARE UNINVOLVED. - NO DYSPLASIA OR MALIGNANCY IDENTIFIED. - FOUR BENIGN LYMPH NODES WITH NO TUMOR SEEN (0/4). - SEE COMMENT. 2. Lymph node, biopsy, ileocolonic anastomosis - ONE BENIGN LYMPH NODE WITH NO TUMOR SEEN (0/1).  The patient arrives with his mother. He has gained about 15 pounds. He is feeling better. Doing moderate activity. Wanting to get back to weight lifting. 3-4 bowel movements a day, normalizing. Soreness down. In good spirits.  Tapering down onhis prednisone. Due to see Dr. Elnoria Howard with gastroenterology next week  Patient Active Problem List  Diagnosis  . Nontoxic nodular goiter  . Crohn's disease  . 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 Crohn's disease 09-10-2006    Confirmed by Colonoscopy  . Thyroid nodule     watching by ultrasound  . Seasonal allergies   . GERD (gastroesophageal reflux disease)     doesn't take any meds at present time    Past Surgical History  Procedure Date  . 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 Colonoscopy   . Laparoscopic colon resection 04/20/12    w/lysis of adhesions  . Appendectomy 2007  . Creation / revision of ileostomy / jejunostomy 10/2006    "for bowel obstruction that perforated"  . Resection small bowel / closure ileostomy 02/2007  . Colon resection 04/20/2012    Procedure: COLON RESECTION LAPAROSCOPIC;   Surgeon: Ardeth Sportsman, MD;  Location: [facility] OR;  Service: General;  Laterality: N/A;  laparoscopic assisted resection of stricture at old ileocolonic anastomosis    History   Social History  . Marital Status: Single    Spouse Name: N/A    Number of Children: N/A  . Years of Education: N/A   Occupational History  . Not on file.   Social History Main Topics  . Smoking status: Former Smoker -- 0.1 packs/day for .5 years    Types: Cigarettes    Quit date: 01/22/2012  . Smokeless tobacco: Never Used  . Alcohol Use: Yes     "quit alcohol 01/22/12"  . Drug Use: Yes    Special: Marijuana     04/20/12 "last marijuana 02/2012"  . Sexually Active: Yes   Other Topics Concern  . Not on file   Social History Narrative  . No narrative on file    Family History  Problem Relation Age of Onset  . Hypertension Father   . Anesthesia problems Neg Hx   . Hypotension Neg Hx   . Malignant hyperthermia Neg Hx   . Pseudochol deficiency Neg Hx     Current Outpatient Prescriptions  Medication Sig Dispense Refill  . mercaptopurine (PURINETHOL) 50 MG tablet Take 50 mg by mouth daily. Give on an empty stomach 1 hour before or 2 hours after meals. Caution: Chemotherapy.      . predniSONE (DELTASONE) 20 MG tablet Take 10 mg by mouth daily.  No Known Allergies  BP 110/58  Pulse 102  Temp 98.7 F (37.1 C) (Temporal)  Ht 5' 9.5" (1.765 m)  Wt 138 lb 12.8 oz (62.959 kg)  BMI 20.20 kg/m2  SpO2 98%  No results found.   Review of Systems  Constitutional: Negative for fever, chills, diaphoresis and fatigue.  HENT: Negative for nosebleeds, sore throat, facial swelling, mouth sores, trouble swallowing, neck pain and ear discharge.   Eyes: Negative for photophobia, discharge and visual disturbance.  Respiratory: Negative for choking, chest tightness, shortness of breath and stridor.   Cardiovascular: Negative for chest pain and palpitations.  Gastrointestinal: Negative for nausea,  vomiting, abdominal pain, diarrhea, constipation, blood in stool, abdominal distention, anal bleeding and rectal pain.       BM 3-4 x day  Genitourinary: Negative for dysuria, urgency, difficulty urinating and testicular pain.  Musculoskeletal: Negative for myalgias, back pain, arthralgias and gait problem.  Skin: Negative for color change, pallor, rash and wound.  Neurological: Negative for dizziness, speech difficulty, weakness, numbness and headaches.  Hematological: Negative for adenopathy. Does not bruise/bleed easily.  Psychiatric/Behavioral: Negative for hallucinations, confusion and agitation.       Objective:   Physical Exam  Constitutional: He is oriented to person, place, and time. He appears well-developed and well-nourished. No distress.  HENT:  Head: Normocephalic.  Mouth/Throat: Oropharynx is clear and moist. No oropharyngeal exudate.  Eyes: Conjunctivae and EOM are normal. Pupils are equal, round, and reactive to light. No scleral icterus.  Neck: Normal range of motion. No tracheal deviation present.  Cardiovascular: Normal rate, normal heart sounds and intact distal pulses.   Pulmonary/Chest: Effort normal. No respiratory distress.  Abdominal: Soft. He exhibits no distension and no mass. There is no tenderness. There is no rebound and no guarding. Hernia confirmed negative in the right inguinal area and confirmed negative in the left inguinal area.       Incisions clean with normal healing ridges.  No hernias  Musculoskeletal: Normal range of motion. He exhibits no tenderness.  Neurological: He is alert and oriented to person, place, and time. No cranial nerve deficit. He exhibits normal muscle tone. Coordination normal.  Skin: Skin is warm and dry. No rash noted. He is not diaphoretic.  Psychiatric: He has a normal mood and affect. His behavior is normal.       Assessment:     Recurrent active Crohn's despite immunosuppression, recovering from 2nd resection      Plan:     Increase activity as tolerated.  Do not push through pain.  Advance diet as tolerated. Bowel regimen to avoid problems.  Wean immunosuppression per Dr. Elnoria Howard. Anti-inflammatory/immunosuppressive regimen per gastroenterology.  Return to clinic p.r.n. The patient expressed understanding and appreciation

## 2012-05-16 NOTE — Patient Instructions (Signed)
Crohn's Disease Crohn's disease is a long-term (chronic) soreness and redness (inflammation) of the intestines (bowel). It can affect any portion of the digestive tract, from the mouth to the anus. It can also cause problems outside the digestive tract. Crohn's disease is closely related to a disease called ulcerative colitis (together, these two diseases are called inflammatory bowel disease).  CAUSES  The cause of Crohn's disease is not known. One Link Snuffer is that, in an easily affected (susceptible) person, the immune system is triggered to attack the body's own digestive tissue. Crohn's disease runs in families. It seems to be more common in certain geographic areas and amongst certain races. There are no clear-cut dietary causes.  SYMPTOMS  Crohn's disease can cause many different symptoms since it can affect many different parts of the body. Symptoms include:  Fatigue.   Weight loss.   Chronic diarrhea, sometime bloody.   Abdominal pain and cramps.   Fever.   Ulcers or canker sores in the mouth or rectum.   Anemia (low red blood cells).   Arthritis, skin problems, and eye problems may occur.  Complications of Crohn's disease can include:  Series of holes (perforation) of the bowel.   Portions of the intestines sticking to each other (adhesions).   Obstruction of the bowel.   Fistula formation, typically in the rectal area but also in other areas. A fistula is an opening between the bowels and the outside, or between the bowels and another organ.   A painful crack in the mucous membrane of the anus (rectal fissure).  DIAGNOSIS  Your caregiver may suspect Crohn's disease based on your symptoms and an exam. Blood tests may confirm that there is a problem. You may be asked to submit a stool specimen for examination. X-rays and CT scans may be necessary. Ultimately, the diagnosis is usually made after a procedure that uses a flexible tube that is inserted via your mouth or your anus.  This is done under sedation and is called either an upper endoscopy or colonoscopy. With these tests, the specialist can take tiny tissue samples and remove them from the inside of the bowel (biopsy). Examination of this biopsy tissue under a microscope can reveal Crohn's disease as the cause of your symptoms. Due to the many different forms that Crohn's disease can take, symptoms may be present for several years before a diagnosis is made. HOME CARE INSTRUCTIONS   There is no cure for Crohn's disease. The best treatment is frequent checkups with your caregiver.   Symptoms such as diarrhea can be controlled with medications. Avoid foods that have a laxative effect such as fresh fruit, vegetables and dairy products. During flare ups, you can rest your bowel by refraining from solid foods. Drink clear liquids frequently during the day (electrolyte or re-hydrating fluids are best. Your caregiver can help you with suggestions). Drink often to prevent loss of body fluids (dehydration). When diarrhea has cleared, eat small meals and more frequently. Avoid food additives and stimulants such as caffeine (coffee, tea, or chocolate). Enzyme supplements may help if you develop intolerance to a sugar in dairy products (lactose). Ask your caregiver or dietitian about specific dietary instructions.   Try to maintain a positive attitude. Learn relaxation techniques such as self hypnosis, mental imaging, and muscle relaxation.   If possible, avoid stresses which can aggravate your condition.   Exercise regularly.   Follow your diet.   Always get plenty of rest.  SEEK MEDICAL CARE IF:   Your symptoms  fail to improve after a week or two of new treatment.   You experience continued weight loss.   You have ongoing crampy digestion or loose bowels.   You develop a new skin rash, skin sores, or eye problems.  SEEK IMMEDIATE MEDICAL CARE IF:   You have worsening of your symptoms or develop new symptoms.   You  have a fever.   You develop bloody diarrhea.   You develop severe abdominal pain.  MAKE SURE YOU:   Understand these instructions.   Will watch your condition.   Will get help right away if you are not doing well or get worse.  Document Released: 08/19/2005 Document Revised: 10/29/2011 Document Reviewed: 07/18/2007 [facility] Patient Information 2012 [place], [place].  GETTING TO GOOD BOWEL HEALTH. Irregular bowel habits such as constipation and diarrhea can lead to many problems over time.  Having one soft bowel movement a day is the most important way to prevent further problems.  The anorectal canal is designed to handle stretching and feces to safely manage our ability to get rid of solid waste (feces, poop, stool) out of our body.  BUT, hard constipated stools can act like ripping concrete bricks and diarrhea can be a burning fire to this very sensitive area of our body, causing inflamed hemorrhoids, anal fissures, increasing risk is perirectal abscesses, abdominal pain/bloating, an making irritable bowel worse.     The goal: ONE SOFT BOWEL MOVEMENT A DAY!  To have soft, regular bowel movements:    Drink at least 8 tall glasses of water a day.     Take plenty of fiber.  Fiber is the undigested part of plant food that passes into the colon, acting s "natures broom" to encourage bowel motility and movement.  Fiber can absorb and hold large amounts of water. This results in a larger, bulkier stool, which is soft and easier to pass. Work gradually over several weeks up to 6 servings a day of fiber (25g a day even more if needed) in the form of: o Vegetables -- Root (potatoes, carrots, turnips), leafy green (lettuce, salad greens, celery, spinach), or cooked high residue (cabbage, broccoli, etc) o Fruit -- Fresh (unpeeled skin & pulp), Dried (prunes, apricots, cherries, etc ),  or stewed ( applesauce)  o Whole grain breads, pasta, etc (whole wheat)  o Bran cereals    Bulking Agents -- This type  of water-retaining fiber generally is easily obtained each day by one of the following:  o Psyllium bran -- The psyllium plant is remarkable because its ground seeds can retain so much water. This product is available as Metamucil, Konsyl, Effersyllium, Per Diem Fiber, or the less expensive generic preparation in drug and health food stores. Although labeled a laxative, it really is not a laxative.  o Methylcellulose -- This is another fiber derived from wood which also retains water. It is available as Citrucel. o Polyethylene Glycol - and "artificial" fiber commonly called Miralax or Glycolax.  It is helpful for people with gassy or bloated feelings with regular fiber o Flax Seed - a less gassy fiber than psyllium   No reading or other relaxing activity while on the toilet. If bowel movements take longer than 5 minutes, you are too constipated   AVOID CONSTIPATION.  High fiber and water intake usually takes care of this.  Sometimes a laxative is needed to stimulate more frequent bowel movements, but    Laxatives are not a good long-term solution as it can wear the colon  out. o Osmotics (Milk of Magnesia, Fleets phosphosoda, Magnesium citrate, MiraLax, GoLytely) are safer than  o Stimulants (Senokot, Castor Oil, Dulcolax, Ex Lax)    o Do not take laxatives for more than 7days in a row.    IF SEVERELY CONSTIPATED, try a Bowel Retraining Program: o Do not use laxatives.  o Eat a diet high in roughage, such as bran cereals and leafy vegetables.  o Drink six (6) ounces of prune or apricot juice each morning.  o Eat two (2) large servings of stewed fruit each day.  o Take one (1) heaping tablespoon of a psyllium-based bulking agent twice a day. Use sugar-free sweetener when possible to avoid excessive calories.  o Eat a normal breakfast.  o Set aside 15 minutes after breakfast to sit on the toilet, but do not strain to have a bowel movement.  o If you do not have a bowel movement by the third day, use  an enema and repeat the above steps.    Controlling diarrhea o Switch to liquids and simpler foods for a few days to avoid stressing your intestines further. o Avoid dairy products (especially milk & ice cream) for a short time.  The intestines often can lose the ability to digest lactose when stressed. o Avoid foods that cause gassiness or bloating.  Typical foods include beans and other legumes, cabbage, broccoli, and dairy foods.  Every person has some sensitivity to other foods, so listen to our body and avoid those foods that trigger problems for you. o Adding fiber (Citrucel, Metamucil, psyllium, Miralax) gradually can help thicken stools by absorbing excess fluid and retrain the intestines to act more normally.  Slowly increase the dose over a few weeks.  Too much fiber too soon can backfire and cause cramping & bloating. o Probiotics (such as active yogurt, Align, etc) may help repopulate the intestines and colon with normal bacteria and calm down a sensitive digestive tract.  Most studies show it to be of mild help, though, and such products can be costly. o Medicines:   Bismuth subsalicylate (ex. Kayopectate, Pepto Bismol) every 30 minutes for up to 6 doses can help control diarrhea.  Avoid if pregnant.   Loperamide (Immodium) can slow down diarrhea.  Start with two tablets (4mg  total) first and then try one tablet every 6 hours.  Avoid if you are having fevers or severe pain.  If you are not better or start feeling worse, stop all medicines and call your doctor for advice o Call your doctor if you are getting worse or not better.  Sometimes further testing (cultures, endoscopy, X-ray studies, bloodwork, etc) may be needed to help diagnose and treat the cause of the diarrhea.

## 2012-06-01 ENCOUNTER — Encounter (INDEPENDENT_AMBULATORY_CARE_PROVIDER_SITE_OTHER): Payer: Self-pay

## 2012-10-24 IMAGING — CR DG ABDOMEN ACUTE W/ 1V CHEST
3 series · 3 of 3 positions shown · non-contrast
Comparison: 03/25/2012

CLINICAL DATA: Luminal pain, vomiting, history of Crohn's disease

ACUTE ABDOMEN SERIES (ABDOMEN 2 VIEW & CHEST 1 VIEW)

[w chest pa]
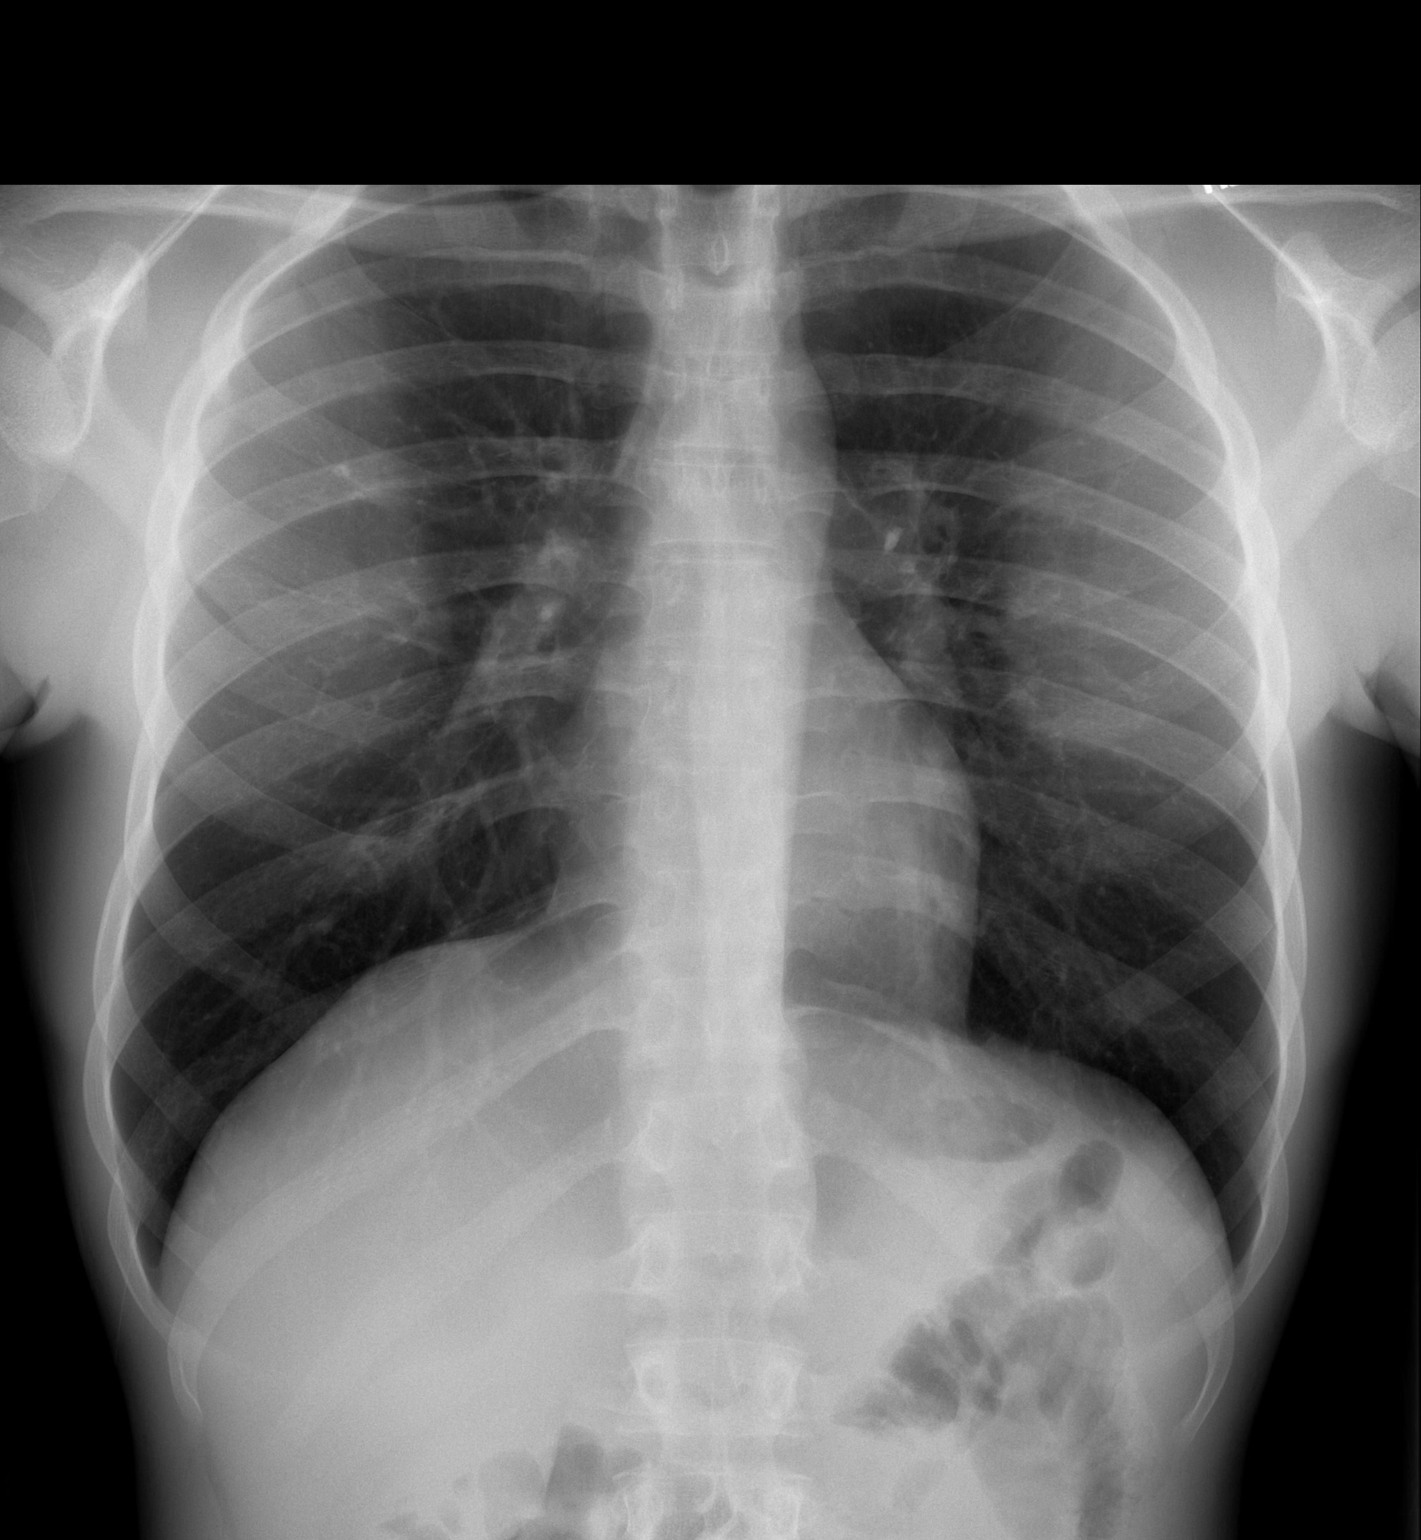

[w abdomen upright]
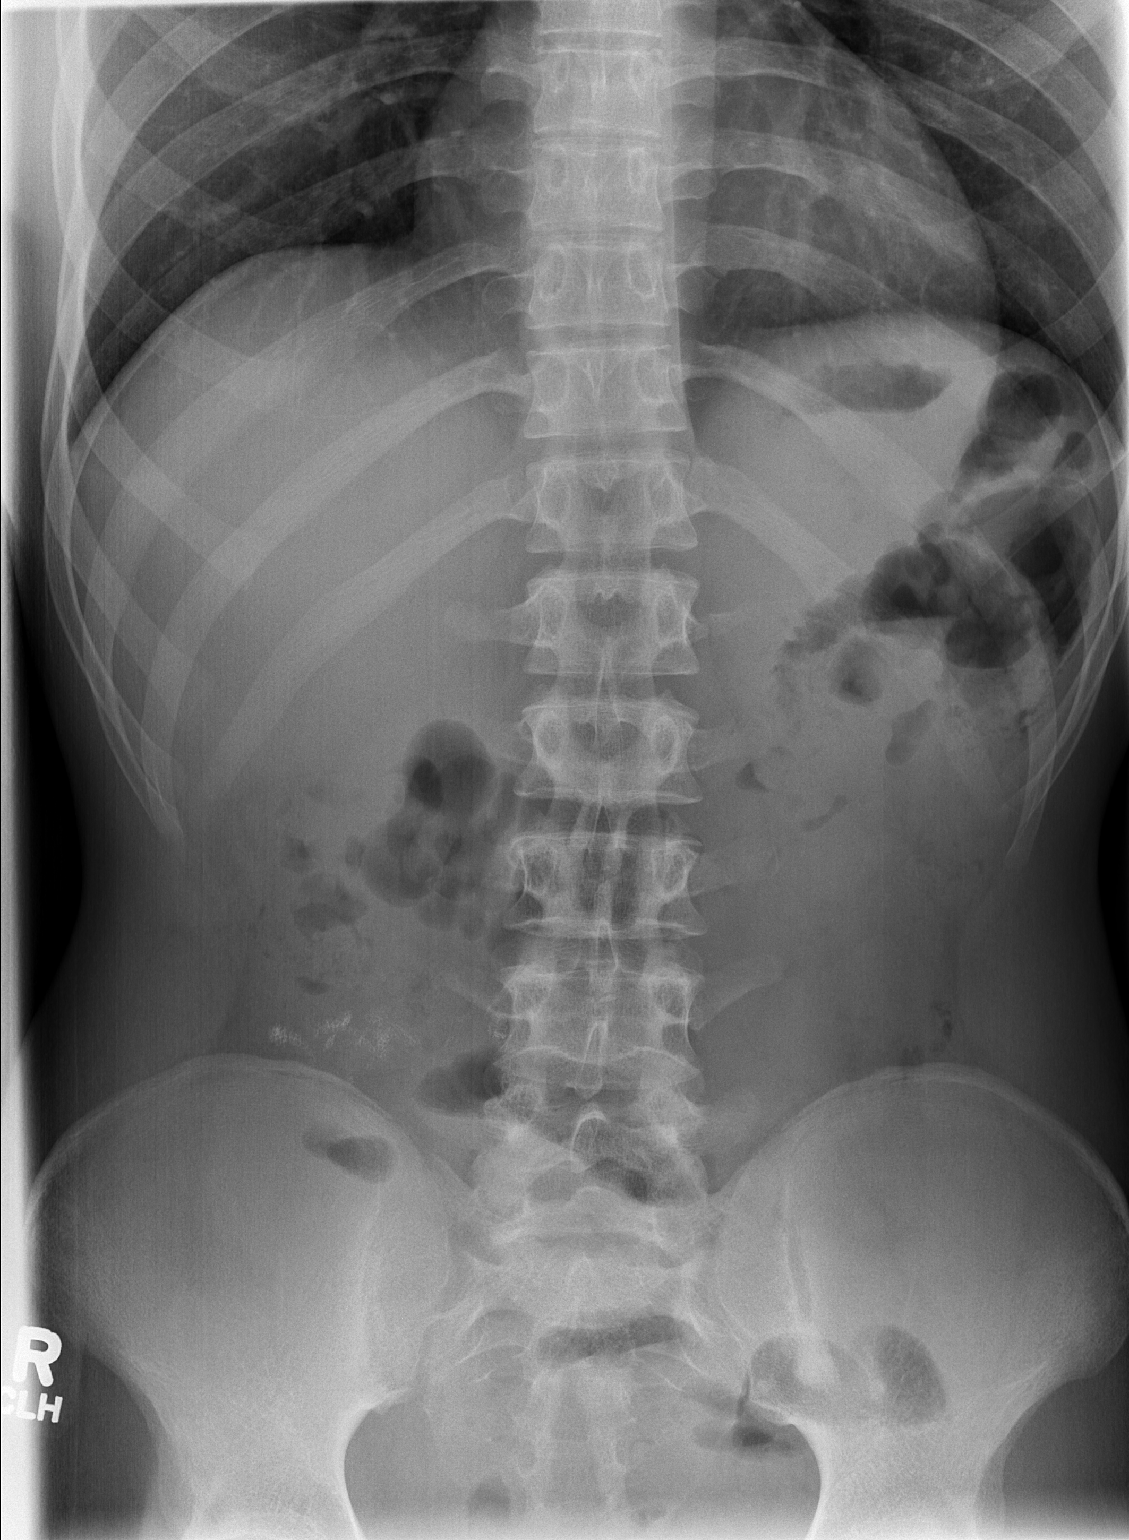

[t abdomen supine]
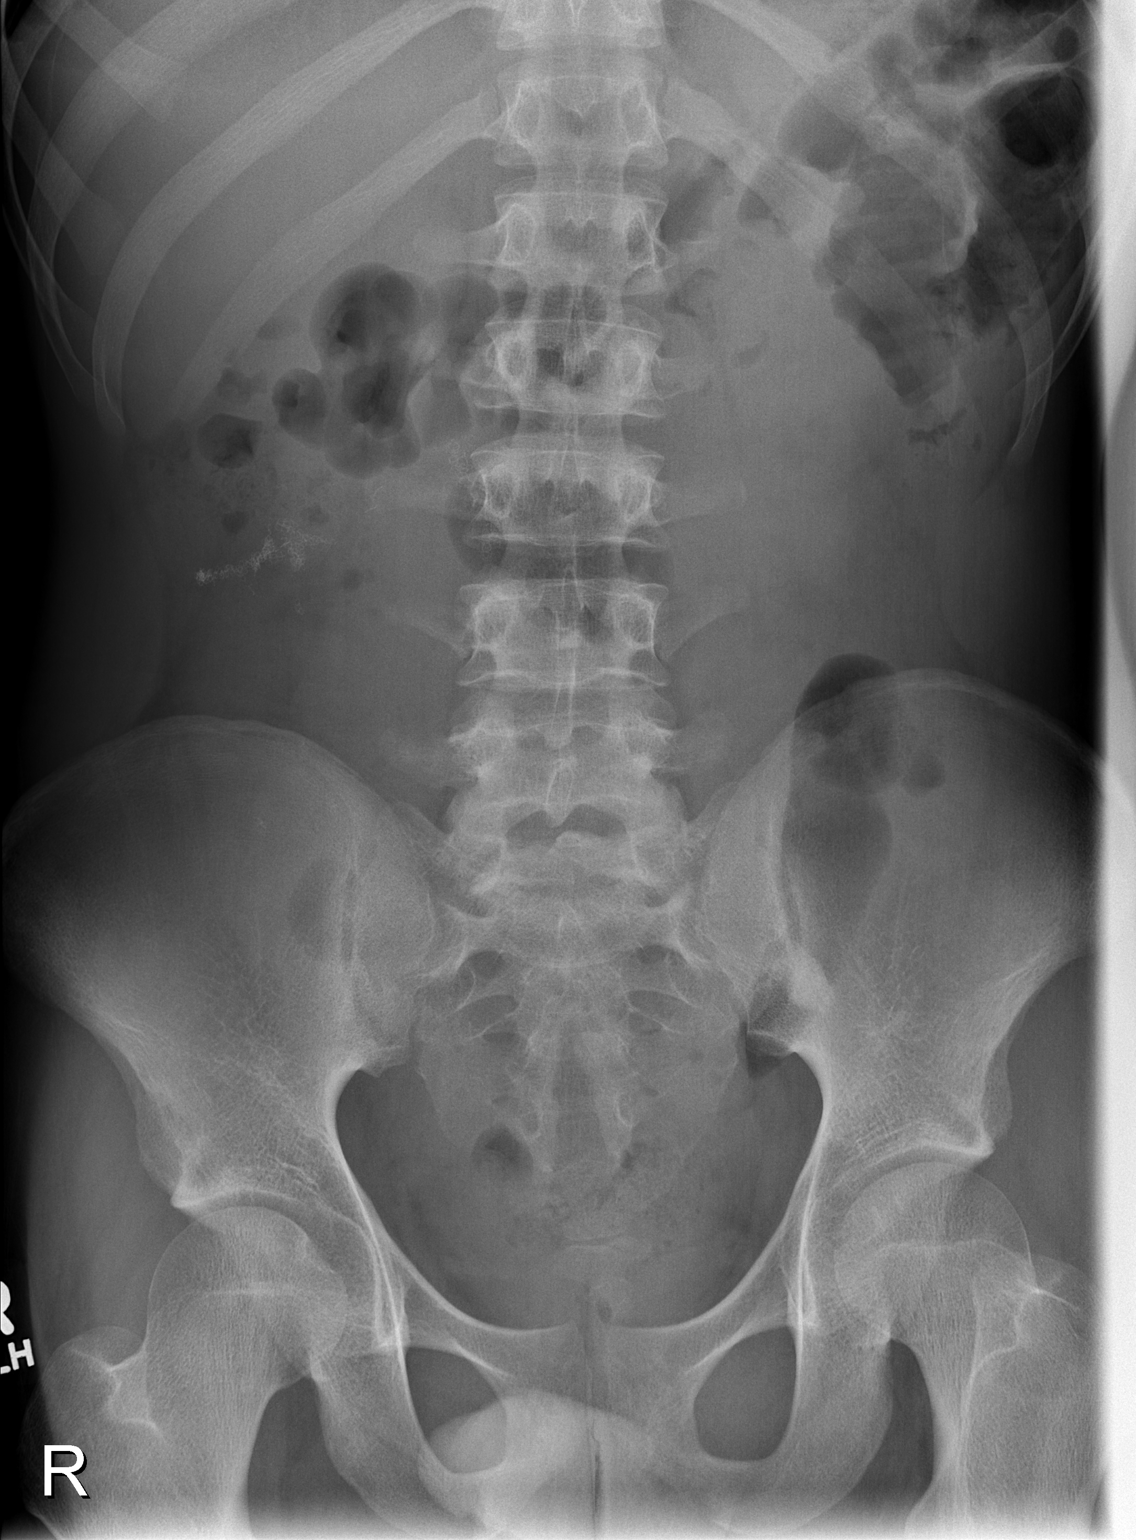

[3 of 3 positions shown; findings below may reference images not displayed]

FINDINGS: Cardiomediastinal silhouette is unremarkable.  No acute
infiltrate or pleural effusion.  No pulmonary edema.

No free abdominal air.  Stable postsurgical changes right mid
abdomen.  Minimal distended small bowel loops lower abdomen
suspicious for ileus or early partial bowel obstruction.
IMPRESSION: No acute disease within chest.  Minimal distended small bowel loops
in the lower abdomen suspicious for ileus or early partial bowel
obstruction.

## 2015-07-01 ENCOUNTER — Ambulatory Visit (INDEPENDENT_AMBULATORY_CARE_PROVIDER_SITE_OTHER): Payer: BLUE CROSS/BLUE SHIELD | Admitting: Emergency Medicine

## 2015-07-01 VITALS — BP 110/60 | HR 68 | Temp 98.3°F | Resp 18 | Ht 70.0 in | Wt 153.0 lb

## 2015-07-01 DIAGNOSIS — A64 Unspecified sexually transmitted disease: Secondary | ICD-10-CM

## 2015-07-01 MED ORDER — AZITHROMYCIN 250 MG PO TABS: ORAL_TABLET | ORAL | Status: DC

## 2015-07-01 MED ORDER — CEFTRIAXONE SODIUM 1 G IJ SOLR
250.0000 mg | Freq: Once | INTRAMUSCULAR | Status: AC
Start: 2015-07-01 — End: 2015-07-01
  Administered 2015-07-01: 250 mg via INTRAMUSCULAR

## 2015-07-01 NOTE — Progress Notes (Signed)
Subjective:  Patient ID: Nathan Lamb., male    DOB: Jul 17, 1991  Age: 24 y.o. MRN: [id number]  CC: Dysuria and yellow discharge   HPI [name]. presents   A greenish purulent drip. He has had unprotected intercourse and is concerned that he has an STD. He denies any fever chills nausea vomiting. Has no rash. He has no prior history of STD.  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   He has past surgical history that includes Meckel's diverticulum excision (02/2007); Colonoscopy; Laparoscopic colon resection (04/20/12); Appendectomy (2007); Creation / revision of ileostomy / jejunostomy (10/2006); Resection small bowel / closure ileostomy (02/2007); and Colon resection (04/20/2012).   His  family history includes Hypertension in his father. There is no history of Anesthesia problems, Hypotension, Malignant hyperthermia, or Pseudochol deficiency.  He   reports that he quit smoking about 3 years ago. His smoking use included Cigarettes. He has a .05 pack-year smoking history. He has never used smokeless tobacco. He reports that he drinks alcohol. He reports that he uses illicit drugs (Marijuana).  Outpatient Prescriptions Prior to Visit  Medication Sig Dispense Refill  . mercaptopurine (PURINETHOL) 50 MG tablet Take 50 mg by mouth daily. Give on an empty stomach 1 hour before or 2 hours after meals. Caution: Chemotherapy.    . predniSONE (DELTASONE) 20 MG tablet Take 10 mg by mouth daily.      No facility-administered medications prior to visit.    History   Social History  . Marital Status: Single    Spouse Name: N/A  . Number of Children: N/A  . Years of Education: N/A   Social History Main Topics  . Smoking status: Former Smoker -- 0.10 packs/day for .5 years    Types:  Cigarettes    Quit date: 01/22/2012  . Smokeless tobacco: Never Used  . Alcohol Use: Yes     Comment: "quit alcohol 01/22/12"  . Drug Use: Yes    Special: Marijuana     Comment: 04/20/12 "last marijuana 02/2012"  . Sexual Activity: Yes   Other Topics Concern  . None   Social History Narrative     Review of Systems  Constitutional: Negative for fever, chills and appetite change.  HENT: Negative for congestion, ear pain, postnasal drip, sinus pressure and sore throat.   Eyes: Negative for pain and redness.  Respiratory: Negative for cough, shortness of breath and wheezing.   Cardiovascular: Negative for leg swelling.  Gastrointestinal: Negative for nausea, vomiting, abdominal pain, diarrhea, constipation and blood in stool.  Endocrine: Negative for polyuria.  Genitourinary: Negative for dysuria, urgency, frequency and flank pain.  Musculoskeletal: Negative for gait problem.  Skin: Negative for rash.  Neurological: Negative for weakness and headaches.  Psychiatric/Behavioral: Negative for confusion and decreased concentration. The patient is not nervous/anxious.     Objective:  BP 110/60 mmHg  Pulse 68  Temp(Src) 98.3 F (36.8 C) (Oral)  Resp 18  Ht 5\' 10"  (1.778 m)  Wt 153 lb (69.4 kg)  BMI 21.95 kg/m2  SpO2 99%  Physical Exam  Constitutional: He is oriented to person, place, and time. He appears well-developed and well-nourished. No distress.  HENT:  Head: Normocephalic and atraumatic.  Right Ear: External ear normal.  Left Ear: External ear normal.  Nose: Nose normal.  Eyes: Conjunctivae and EOM are normal. Pupils are  equal, round, and reactive to light. No scleral icterus.  Neck: Normal range of motion. Neck supple. No tracheal deviation present.  Cardiovascular: Normal rate, regular rhythm and normal heart sounds.   Pulmonary/Chest: Effort normal. No respiratory distress. He has no wheezes. He has no rales.  Abdominal: He exhibits no mass. There is no tenderness.  There is no rebound and no guarding.  Musculoskeletal: He exhibits no edema.  Lymphadenopathy:    He has no cervical adenopathy.  Neurological: He is alert and oriented to person, place, and time. Coordination normal.  Skin: Skin is warm and dry. No rash noted.  Psychiatric: He has a normal mood and affect. His behavior is normal.      Assessment & Plan:   Larone was seen today for dysuria and yellow discharge.  Diagnoses and all orders for this visit:  STD (male) Orders: -     HIV antibody -     HSV(herpes simplex vrs) 1+2 ab-IgG -     RPR -     GC/Chlamydia Probe Amp -     cefTRIAXone (ROCEPHIN) injection 250 mg; Inject 0.25 g (250 mg total) into the muscle once.  Other orders -     azithromycin (ZITHROMAX) 250 MG tablet; Take today in one dose   I am having Mr. Shidler start on azithromycin. I am also having him maintain his mercaptopurine and predniSONE. We administered cefTRIAXone.  Meds ordered this encounter  Medications  . cefTRIAXone (ROCEPHIN) injection 250 mg    Sig:     Order Specific Question:  Antibiotic Indication:    Answer:  STD  . azithromycin (ZITHROMAX) 250 MG tablet    Sig: Take today in one dose    Dispense:  4 tablet    Refill:  0    Appropriate red flag conditions were discussed with the patient as well as actions that should be taken.  Patient expressed his understanding.  Follow-up: Return if symptoms worsen or fail to improve.  Carmelina Dane, MD

## 2015-07-01 NOTE — Patient Instructions (Signed)

## 2015-07-02 LAB — HSV(HERPES SIMPLEX VRS) I + II AB-IGG: HSV 1 GLYCOPROTEIN G AB, IGG: 1.69 IV — AB

## 2015-07-02 LAB — RPR

## 2015-07-02 LAB — GC/CHLAMYDIA PROBE AMP
CT PROBE, AMP APTIMA: NEGATIVE
GC Probe RNA: POSITIVE — AB

## 2015-07-02 LAB — HIV ANTIBODY (ROUTINE TESTING W REFLEX): HIV 1&2 Ab, 4th Generation: NONREACTIVE

## 2016-01-22 ENCOUNTER — Ambulatory Visit (INDEPENDENT_AMBULATORY_CARE_PROVIDER_SITE_OTHER): Payer: BLUE CROSS/BLUE SHIELD | Admitting: Physician Assistant

## 2016-01-22 VITALS — BP 118/76 | HR 90 | Temp 101.1°F | Resp 16 | Ht 69.0 in | Wt 164.0 lb

## 2016-01-22 DIAGNOSIS — R6889 Other general symptoms and signs: Secondary | ICD-10-CM | POA: Diagnosis not present

## 2016-01-22 MED ORDER — ONDANSETRON 4 MG PO TBDP: 4.0000 mg | ORAL_TABLET | Freq: Three times a day (TID) | ORAL | Status: DC | PRN

## 2016-01-22 MED ORDER — OSELTAMIVIR PHOSPHATE 75 MG PO CAPS: 75.0000 mg | ORAL_CAPSULE | Freq: Two times a day (BID) | ORAL | Status: DC

## 2016-01-22 MED ORDER — HYDROCOD POLST-CPM POLST ER 10-8 MG/5ML PO SUER: 5.0000 mL | Freq: Two times a day (BID) | ORAL | Status: DC | PRN

## 2016-01-22 NOTE — Patient Instructions (Signed)
Drink plenty of water (64 oz/day) and get plenty of rest. If you have been prescribed a cough syrup, do not drive or operate heavy machinery while using this medication. Ibuprofen/tylenol around the clock for fever, body aches tamiflu twice a day for 5 days - may cause diarrhea, nausea -- stop if not tolerable zofran as needed for nausea. If your symptoms are not improving in 7-10 days or at any time if symptoms worsen, return to clinic.

## 2016-01-22 NOTE — Progress Notes (Signed)
[facility] [address] [place] [address] [phone]  Date:  01/22/2016   Name:  Nathan Lamb.   DOB:  07-23-1991   MRN:  [id number]  PCP:  Margaree Mackintosh, MD    Chief Complaint: Cough; Nausea; Chills; and Nasal Congestion   History of Present Illness:  This is a 25 y.o. male with PMH Crohn's disease and thyroid goiter who is presenting with cough, nasal congestion, chills, fever and nausea since waking this morning. Cough is a mix of dry and productive. Mild sore throat, only hurts with coughing. No vomiting. Temp 101.1 here, did not check at home. Taking dayquil and nyquil and helps some. Did not get flu shot this season. No sick contacts. On humira for crohn's. Last flare 3 years ago.  Pt had allergy to dilaudid in the past (itching). Has had hydrocodone and tolerates well.  Review of Systems:  Review of Systems See HPI  Patient Active Problem List   Diagnosis Date Noted  . 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 Crohn's disease (HCC) 05/04/2011    Prior to Admission medications   Medication Sig Start Date End Date Taking? Authorizing Provider  Adalimumab (HUMIRA PEN) 40 MG/0.8ML PNKT Inject into the skin.   Yes Historical Provider, MD    Allergies  Allergen Reactions  . Dilaudid [Hydromorphone Hcl] Itching    Past Surgical History  Procedure Laterality Date  . Meckel diverticulum excision  02/2007  . Colonoscopy    . Laparoscopic colon resection  04/20/12    w/lysis of adhesions  . Appendectomy  2007  . Creation / revision of ileostomy / jejunostomy  10/2006    "for bowel obstruction that perforated"  . Resection small bowel / closure ileostomy  02/2007  . Colon resection  04/20/2012    Procedure: COLON RESECTION LAPAROSCOPIC;  Surgeon: Ardeth Sportsman, MD;  Location: [facility] OR;  Service: General;  Laterality: N/A;  laparoscopic assisted resection of stricture at old ileocolonic anastomosis    Social History  Substance Use Topics   . Smoking status: Former Smoker -- 0.10 packs/day for .5 years    Types: Cigarettes    Quit date: 01/22/2012  . Smokeless tobacco: Never Used  . Alcohol Use: Yes     Comment: "quit alcohol 01/22/12"    Family History  Problem Relation Age of Onset  . Hypertension Father   . Anesthesia problems Neg Hx   . Hypotension Neg Hx   . Malignant hyperthermia Neg Hx   . Pseudochol deficiency Neg Hx     Medication list has been reviewed and updated.  Physical Examination:  Physical Exam  Constitutional: He is oriented to person, place, and time. He appears well-developed and well-nourished. No distress.  HENT:  Head: Normocephalic and atraumatic.  Right Ear: Hearing, tympanic membrane, external ear and ear canal normal.  Left Ear: Hearing, tympanic membrane, external ear and ear canal normal.  Nose: Nose normal.  Mouth/Throat: Uvula is midline and mucous membranes are normal. Posterior oropharyngeal erythema present. No oropharyngeal exudate or posterior oropharyngeal edema.  Eyes: Conjunctivae and lids are normal. Right eye exhibits no discharge. Left eye exhibits no discharge. No scleral icterus.  Cardiovascular: Normal rate, regular rhythm, normal heart sounds and normal pulses.   No murmur heard. Pulmonary/Chest: Effort normal and breath sounds normal. No respiratory distress. He has no wheezes. He has no rhonchi. He has no rales.  Abdominal: Soft. Normal appearance. There is tenderness (mild, general).  Musculoskeletal: Normal range  of motion.  Lymphadenopathy:       Head (right side): No submental, no submandibular and no tonsillar adenopathy present.       Head (left side): No submental, no submandibular and no tonsillar adenopathy present.    He has no cervical adenopathy.  Neurological: He is alert and oriented to person, place, and time.  Skin: Skin is warm, dry and intact. No lesion and no rash noted.  Psychiatric: He has a normal mood and affect. His speech is normal and  behavior is normal. Thought content normal.   BP 118/76 mmHg  Pulse 90  Temp(Src) 101.1 F (38.4 C) (Oral)  Resp 16  Ht  (1.753 m)  Wt 164 lb (74.39 kg)  BMI 24.21 kg/m2  SpO2 98%  Assessment and Plan:  1. Flu-like symptoms Treat empirically, patient did not want to do flu swab. If nausea worsens from tamiflu, may take zofran. If unable to tolerate, stop. Counseled on hydration and supportive care. Return in 1 week if symptoms do not improve or at any time if symptoms worsen.  - oseltamivir (TAMIFLU) 75 MG capsule; Take 1 capsule (75 mg total) by mouth 2 (two) times daily.  Dispense: 10 capsule; Refill: 0 - chlorpheniramine-HYDROcodone (TUSSIONEX PENNKINETIC ER) 10-8 MG/5ML SUER; Take 5 mLs by mouth every 12 (twelve) hours as needed for cough.  Dispense: 100 mL; Refill: 0 - ondansetron (ZOFRAN ODT) 4 MG disintegrating tablet; Take 1 tablet (4 mg total) by mouth every 8 (eight) hours as needed for nausea or vomiting.  Dispense: 20 tablet; Refill: 0   Roswell Miners. Dyke Brackett, MHS [facility]  01/22/2016

## 2022-03-14 ENCOUNTER — Other Ambulatory Visit: Payer: Self-pay

## 2022-03-14 ENCOUNTER — Ambulatory Visit
Admission: EM | Admit: 2022-03-14 | Discharge: 2022-03-14 | Disposition: A | Discharge status: Home / Self Care | Payer: 59 | Attending: Urgent Care | Admitting: Urgent Care

## 2022-03-14 DIAGNOSIS — L0231 Cutaneous abscess of buttock: Secondary | ICD-10-CM | POA: Insufficient documentation

## 2022-03-14 MED ORDER — SULFAMETHOXAZOLE-TRIMETHOPRIM 800-160 MG PO TABS: 1.0000 | ORAL_TABLET | Freq: Two times a day (BID) | ORAL | 0 refills | Status: AC

## 2022-03-14 MED ORDER — CEPHALEXIN 500 MG PO CAPS: 500.0000 mg | ORAL_CAPSULE | Freq: Four times a day (QID) | ORAL | 0 refills | Status: AC

## 2022-03-14 NOTE — ED Triage Notes (Signed)
Pt c/o anal abscess 2/2 crohn's dz first noticed on Wednesday. Denies drainage and pain but uncomfortable when sitting.  ?

## 2022-03-14 NOTE — Discharge Instructions (Addendum)
Your abscess was aspirated in clinic. ?There was no remaining material to express. ?Please soak in warm Epsom salt bath 2-3 times this afternoon.  After each soak apply the topical bacitracin ointment to the affected area. ?Start taking Bactrim twice daily until gone. ?Start taking Keflex 4 times daily until gone. ?Keep your appointment with your GI specialist on Monday, had to the emergency room if you develop any systemic symptoms such as fever, nausea, body aches, or worsening pain. ?

## 2022-03-15 ENCOUNTER — Encounter: Payer: Self-pay | Admitting: Urgent Care

## 2022-03-15 DIAGNOSIS — L0231 Cutaneous abscess of buttock: Secondary | ICD-10-CM | POA: Diagnosis not present

## 2022-03-15 NOTE — ED Provider Notes (Signed)
?[facility] ? ? ? ?CSN: [id number] ?Arrival date & time: 03/14/22  6144 ? ? ?  ? ?History   ?Chief Complaint ?Chief Complaint  ?Patient presents with  ? Abscess  ? ? ?HPI ?Nathan Lamb. is a 31 y.o. male.  ? ?Pleasant 31yo male with a known hx of Crohns disease which is well controlled and in remission on Humira, presents for concerns of an abscess to his buttocks since Wednesday. Reports that it started out as a small uncomfortable "bump" internally, but has been growing in size and discomfort level. Reports moderate discomfort when standing, but more severe pain while seated. He has not been able to express any fluid, it is not draining. Pt denies fever, chills, spreading of lesions, or any additional systemic symptoms. Has applied warm compresses without relief. Reports hx of similar issue 2 years ago. ? ? ?Abscess ? ?Past Medical History:  ?Diagnosis Date  ? Crohn's disease (HCC) 09-10-2006  ? Confirmed by Colonoscopy  ? 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 GERD (gastroesophageal reflux disease)   ? doesn't take any meds at present time  ? Seasonal allergies   ? Thyroid nodule   ? watching by ultrasound  ? ? ?Patient Active Problem List  ? Diagnosis Date Noted  ? 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 Crohn's disease (HCC) 05/04/2011  ? ? ?Past Surgical History:  ?Procedure Laterality Date  ? APPENDECTOMY  2007  ? COLON RESECTION  04/20/2012  ? Procedure: COLON RESECTION LAPAROSCOPIC;  Surgeon: Ardeth Sportsman, MD;  Location: [facility] OR;  Service: General;  Laterality: N/A;  laparoscopic assisted resection of stricture at old ileocolonic anastomosis  ? COLONOSCOPY    ? CREATION / REVISION OF ILEOSTOMY / JEJUNOSTOMY  10/2006  ? "for bowel obstruction that perforated"  ? LAPAROSCOPIC COLON RESECTION  04/20/12  ? w/lysis of adhesions  ? MECKEL DIVERTICULUM EXCISION  02/2007  ? RESECTION SMALL BOWEL / CLOSURE ILEOSTOMY  02/2007  ? ? ? ? ? ?Home Medications    ? ?Prior to Admission medications   ?Medication Sig Start Date End Date Taking? Authorizing Provider  ?cephALEXin (KEFLEX) 500 MG capsule Take 1 capsule (500 mg total) by mouth 4 (four) times daily for 7 days. 03/14/22 03/21/22 Yes Kongmeng Santoro L, PA  ?sulfamethoxazole-trimethoprim (BACTRIM DS) 800-160 MG tablet Take 1 tablet by mouth 2 (two) times daily for 7 days. 03/14/22 03/21/22 Yes Ryhanna Dunsmore L, PA  ?Adalimumab (HUMIRA PEN) 40 MG/0.8ML PNKT Inject into the skin.    [provider]  ? ? ?Family History ?Family History  ?Problem Relation Age of Onset  ? Hypertension Father   ? Anesthesia problems Neg Hx   ? Hypotension Neg Hx   ? Malignant hyperthermia Neg Hx   ? Pseudochol deficiency Neg Hx   ? ? ?Social History ?Social History  ? ?Tobacco Use  ? Smoking status: Former  ?  Packs/day: 0.10  ?  Years: 0.50  ?  Pack years: 0.05  ?  Types: Cigarettes  ?  Quit date: 01/22/2012  ?  Years since quitting: 10.1  ? Smokeless tobacco: Never  ?Substance Use Topics  ? Alcohol use: Yes  ?  Comment: "quit alcohol 01/22/12"  ? Drug use: Yes  ?  Types: Marijuana  ?  Comment: 04/20/12 "last marijuana 02/2012"  ? ? ? ?Allergies   ?Dilaudid [hydromorphone hcl] ? ? ?Review of Systems ?Review of Systems  ?Skin:  Positive for wound (abscess buttock).  ?  All other systems reviewed and are negative. ? ? ?Physical Exam ?Triage Vital Signs ?ED Triage Vitals [03/14/22 1042]  ?Enc Vitals Group  ?   BP 106/68  ?   Pulse Rate 63  ?   Resp 18  ?   Temp 98 ?F (36.7 ?C)  ?   Temp Source Oral  ?   SpO2 98 %  ?   Weight   ?   Height   ?   Head Circumference   ?   Peak Flow   ?   Pain Score 0  ?   Pain Loc   ?   Pain Edu?   ?   Excl. in GC?   ? ?No data found. ? ?Updated Vital Signs ?BP 106/68 (BP Location: Right Arm)   Pulse 63   Temp 98 ?F (36.7 ?C) (Oral)   Resp 18   SpO2 98%  ? ?Visual Acuity ?Right Eye Distance:   ?Left Eye Distance:   ?Bilateral Distance:   ? ?Right Eye Near:   ?Left Eye Near:    ?Bilateral Near:    ? ?Physical  Exam ?Vitals and nursing note reviewed.  ?Constitutional:   ?   General: He is not in acute distress. ?   Appearance: Normal appearance. He is normal weight. He is not ill-appearing, toxic-appearing or diaphoretic.  ?HENT:  ?   Head: Normocephalic and atraumatic.  ?Cardiovascular:  ?   Rate and Rhythm: Normal rate.  ?   Pulses: Normal pulses.  ?Pulmonary:  ?   Effort: Pulmonary effort is normal. No respiratory distress.  ?Abdominal:  ?   General: Abdomen is flat. Bowel sounds are normal. There is no distension.  ?   Palpations: Abdomen is soft.  ?   Tenderness: There is no abdominal tenderness. There is no guarding.  ?Skin: ?   General: Skin is warm and dry.  ?   Capillary Refill: Capillary refill takes less than 2 seconds.  ?   Coloration: Skin is not jaundiced.  ?   Findings: Lesion (quarter-sized abscess to R buttock medially and superiorly. There were no anal lesions. Larger sized area of induration deep to abscess. No surrounding cellulitis.) present. No erythema or rash.  ?Neurological:  ?   Mental Status: He is alert.  ? ? ? ?UC Treatments / Results  ?Labs ?(all labs ordered are listed, but only abnormal results are displayed) ?Labs Reviewed  ?AEROBIC/ANAEROBIC CULTURE W GRAM STAIN (SURGICAL/DEEP WOUND)  ? ? ?EKG ? ? ?Radiology ?No results found. ? ?Procedures ?Incision and Drainage ? ?Date/Time: 03/15/2022 11:16 PM ?Performed by: Maretta Beesrain, Veronika Heard L, PA ?Authorized by: Maretta Beesrain, Corydon Schweiss L, PA  ? ?Consent:  ?  Consent obtained:  Verbal ?  Consent given by:  Patient ?  Risks, benefits, and alternatives were discussed: yes   ?  Risks discussed:  Bleeding, incomplete drainage, pain, infection and damage to other organs ?  Alternatives discussed:  No treatment and alternative treatment ?Universal protocol:  ?  Procedure explained and questions answered to patient or proxy's satisfaction: yes   ?  Patient identity confirmed:  Verbally with patient ?Location:  ?  Type:  Abscess ?  Location:  Anogenital ?  Anogenital  location: R buttock. ?Pre-procedure details:  ?  Skin preparation:  Povidone-iodine ?Anesthesia:  ?  Anesthesia method:  Local infiltration ?  Local anesthetic:  Lidocaine 1% w/o epi ?Procedure details:  ?  Needle aspiration: yes   ?  Needle size:  18 G ?  Incision  types:  Stab incision ?  Incision depth:  Subcutaneous ?  Drainage:  Purulent and bloody ?  Drainage amount:  Moderate ?  Wound treatment:  Wound left open ?  Packing materials:  None ?Post-procedure details:  ?  Procedure completion:  Tolerated (including critical care time) ? ?Medications Ordered in UC ?Medications - No data to display ? ?Initial Impression / Assessment and Plan / UC Course  ?I have reviewed the triage vital signs and the nursing notes. ? ?Pertinent labs & imaging results that were available during my care of the patient were reviewed by me and considered in my medical decision making (see chart for details). ? ?  ? ?Abscess of right buttock - aspiration of all contents performed in clinic. It was not at the anus/ rectum but rather superior on the gluteus maximus, lateral to the gluteal cleft. This was safe for performing in office. Wound culture obtained and sent for analysis. Empiric tx with both bactrim and keflex initiated. Continue warm compresses/ soaks. Pt has follow up scheduled with GI specialist on Monday.  ? ?Final Clinical Impressions(s) / UC Diagnoses  ? ?Final diagnoses:  ?Abscess of right buttock  ? ? ? ?Discharge Instructions   ? ?  ?Your abscess was aspirated in clinic. ?There was no remaining material to express. ?Please soak in warm Epsom salt bath 2-3 times this afternoon.  After each soak apply the topical bacitracin ointment to the affected area. ?Start taking Bactrim twice daily until gone. ?Start taking Keflex 4 times daily until gone. ?Keep your appointment with your GI specialist on Monday, had to the emergency room if you develop any systemic symptoms such as fever, nausea, body aches, or worsening  pain. ? ? ? ?ED Prescriptions   ? ? Medication Sig Dispense Auth. Provider  ? cephALEXin (KEFLEX) 500 MG capsule Take 1 capsule (500 mg total) by mouth 4 (four) times daily for 7 days. 28 capsule Kruze Atchley L, PA  ? sulfamethoxa

## 2022-03-17 LAB — AEROBIC/ANAEROBIC CULTURE W GRAM STAIN (SURGICAL/DEEP WOUND): Gram Stain: NONE SEEN

## 2023-02-07 ENCOUNTER — Ambulatory Visit
Admission: EM | Admit: 2023-02-07 | Discharge: 2023-02-07 | Disposition: A | Discharge status: Home / Self Care | Payer: BC Managed Care – PPO | Attending: Internal Medicine | Admitting: Internal Medicine

## 2023-02-07 DIAGNOSIS — L0231 Cutaneous abscess of buttock: Secondary | ICD-10-CM | POA: Diagnosis not present

## 2023-02-07 MED ORDER — AMOXICILLIN-POT CLAVULANATE 875-125 MG PO TABS: 1.0000 | ORAL_TABLET | Freq: Two times a day (BID) | ORAL | 0 refills | Status: DC

## 2023-02-07 MED ORDER — IBUPROFEN 600 MG PO TABS: 600.0000 mg | ORAL_TABLET | Freq: Four times a day (QID) | ORAL | 0 refills | Status: DC | PRN

## 2023-02-07 NOTE — Discharge Instructions (Addendum)
Sitz bath's 1-2 times a day. Please take ibuprofen as needed for pain. Complete the course of antibiotics prescribed. Please return to urgent care if you have worsening symptoms.

## 2023-02-07 NOTE — ED Triage Notes (Signed)
Pt presents with abscess to top of buttocks X 1 week.

## 2023-02-08 DIAGNOSIS — L0231 Cutaneous abscess of buttock: Secondary | ICD-10-CM | POA: Diagnosis not present

## 2023-02-08 NOTE — ED Provider Notes (Signed)
[facility]    CSN: [id number] Arrival date & time: 02/07/23  1444      History   Chief Complaint Chief Complaint  Patient presents with   Abscess    HPI Nathan Lamb. is a 32 y.o. male with a history of Crohn's disease comes to urgent care with painful swelling in the natal cleft.  Patient's symptoms have been ongoing for the past week.  No fever or chills.  Pain is currently severe, throbbing, aggravated by palpation and no known relieving factors.  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   Crohn's disease ([place]) 05/04/2011    Past Surgical History:  Procedure Laterality Date   APPENDECTOMY  2007   COLON RESECTION  04/20/2012   Procedure: COLON RESECTION LAPAROSCOPIC;  Surgeon: Adin Hector, MD;  Location: [facility];  Service: General;  Laterality: N/A;  laparoscopic assisted resection of stricture at old ileocolonic anastomosis   COLONOSCOPY     CREATION / REVISION OF ILEOSTOMY / JEJUNOSTOMY  10/2006   "for bowel obstruction that perforated"   LAPAROSCOPIC COLON RESECTION  04/20/12   w/lysis of adhesions   MECKEL DIVERTICULUM EXCISION  02/2007   RESECTION SMALL BOWEL / CLOSURE ILEOSTOMY  02/2007       Home Medications    Prior to Admission medications   Medication Sig Start Date End Date Taking? Authorizing Provider  amoxicillin-clavulanate (AUGMENTIN) 875-125 MG tablet Take 1 tablet by mouth every 12 (twelve) hours. 02/07/23  Yes Astria Jordahl, Myrene Galas, MD  ibuprofen (ADVIL) 600 MG tablet Take 1 tablet (600  mg total) by mouth every 6 (six) hours as needed. 02/07/23  Yes Jessi Jessop, Myrene Galas, MD  Adalimumab (HUMIRA PEN) 40 MG/0.8ML PNKT Inject into the skin.    [provider]    Family History Family History  Problem Relation Age of Onset   Hypertension Father    Anesthesia problems Neg Hx    Hypotension Neg Hx    Malignant hyperthermia Neg Hx    Pseudochol deficiency Neg Hx     Social History Social History   Tobacco Use   Smoking status: Former    Packs/day: 0.10    Years: 0.50    Additional pack years: 0.00    Total pack years: 0.05    Types: Cigarettes    Quit date: 01/22/2012    Years since quitting: 11.0   Smokeless tobacco: Never  Substance Use Topics   Alcohol use: Yes    Comment: "quit alcohol 01/22/12"   Drug use: Yes    Types: Marijuana    Comment: 04/20/12 "last marijuana 02/2012"     Allergies   Dilaudid [hydromorphone hcl]   Review of Systems Review of Systems As per HPI  Physical Exam Triage Vital Signs ED Triage Vitals [02/07/23 1547]  Enc Vitals Group     BP 126/71     Pulse Rate 73     Resp 18     Temp 98.5 F (36.9 C)  Temp Source Oral     SpO2 98 %     Weight      Height      Head Circumference      Peak Flow      Pain Score 8     Pain Loc      Pain Edu?      Excl. in [place]?    No data found.  Updated Vital Signs BP 126/71 (BP Location: Left Arm)   Pulse 73   Temp 98.5 F (36.9 C) (Oral)   Resp 18   SpO2 98%   Visual Acuity Right Eye Distance:   Left Eye Distance:   Bilateral Distance:    Right Eye Near:   Left Eye Near:    Bilateral Near:     Physical Exam Vitals and nursing note reviewed.  Constitutional:      General: He is not in acute distress.    Appearance: Normal appearance. He is not ill-appearing.  Cardiovascular:     Rate and Rhythm: Normal rate and regular rhythm.  Genitourinary:    Comments: Right gluteal abscess just lateral to the intergluteal cleft.  Fluctuance noted.  Measures about 3 inches in  the longest diameter with overlying erythema.  No discharge noted. Neurological:     Mental Status: He is alert.      UC Treatments / Results  Labs (all labs ordered are listed, but only abnormal results are displayed) Labs Reviewed - No data to display  EKG   Radiology No results found.  Procedures Incision and Drainage  Date/Time: 02/08/2023 5:17 PM  Performed by: Chase Picket, MD Authorized by: Chase Picket, MD   Consent:    Consent obtained:  Verbal   Consent given by:  Patient   Risks discussed:  Bleeding and incomplete drainage   Alternatives discussed:  No treatment and alternative treatment Universal protocol:    Patient identity confirmed:  Verbally with patient Location:    Type:  Abscess   Location:  Anogenital   Anogenital location:  Gluteal cleft Pre-procedure details:    Skin preparation:  Povidone-iodine Anesthesia:    Anesthesia method:  Local infiltration   Local anesthetic:  Lidocaine 1% WITH epi Procedure type:    Complexity:  Simple Procedure details:    Ultrasound guidance: no     Needle aspiration: no     Incision types:  Single straight   Incision depth:  Subcutaneous   Wound management:  Probed and deloculated   Drainage:  Purulent and bloody   Drainage amount:  Moderate   Wound treatment:  Wound left open   Packing materials:  None Post-procedure details:    Procedure completion:  Tolerated well, no immediate complications  (including critical care time)  Medications Ordered in UC Medications - No data to display  Initial Impression / Assessment and Plan / UC Course  I have reviewed the triage vital signs and the nursing notes.  Pertinent labs & imaging results that were available during my care of the patient were reviewed by me and considered in my medical decision making (see chart for details).     1.  Gluteal abscess: Incision and drainage completed Sitz bath's 1 to twice a day Ibuprofen 600 mg every 6 hours  as needed for pain and/or fever Augmentin 875-125 mg twice daily Return precautions given. Final Clinical Impressions(s) / UC Diagnoses   Final diagnoses:  Gluteal abscess     Discharge Instructions      Sitz bath's  1-2 times a day. Please take ibuprofen as needed for pain. Complete the course of antibiotics prescribed. Please return to urgent care if you have worsening symptoms.   ED Prescriptions     Medication Sig Dispense Auth. Provider   amoxicillin-clavulanate (AUGMENTIN) 875-125 MG tablet Take 1 tablet by mouth every 12 (twelve) hours. 14 tablet Colbe Viviano, Myrene Galas, MD   ibuprofen (ADVIL) 600 MG tablet Take 1 tablet (600 mg total) by mouth every 6 (six) hours as needed. 30 tablet Damichael Hofman, Myrene Galas, MD      PDMP not reviewed this encounter.   Chase Picket, MD 02/08/23 [phone]

## 2023-03-29 DIAGNOSIS — L0231 Cutaneous abscess of buttock: Secondary | ICD-10-CM | POA: Diagnosis not present

## 2023-04-13 ENCOUNTER — Ambulatory Visit: Payer: Self-pay | Admitting: Surgery

## 2023-04-13 DIAGNOSIS — Z9225 Personal history of immunosupression therapy: Secondary | ICD-10-CM | POA: Diagnosis not present

## 2023-04-13 DIAGNOSIS — K5000 Crohn's disease of small intestine without complications: Secondary | ICD-10-CM | POA: Diagnosis not present

## 2023-04-13 DIAGNOSIS — L988 Other specified disorders of the skin and subcutaneous tissue: Secondary | ICD-10-CM | POA: Diagnosis not present

## 2023-04-28 ENCOUNTER — Other Ambulatory Visit: Payer: Self-pay | Admitting: Surgery

## 2023-04-28 DIAGNOSIS — L0591 Pilonidal cyst without abscess: Secondary | ICD-10-CM | POA: Diagnosis not present

## 2023-08-23 DIAGNOSIS — K50013 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: Secondary | ICD-10-CM | POA: Diagnosis not present

## 2023-09-23 ENCOUNTER — Emergency Department (HOSPITAL_BASED_OUTPATIENT_CLINIC_OR_DEPARTMENT_OTHER): Payer: BC Managed Care – PPO

## 2023-09-23 ENCOUNTER — Encounter (HOSPITAL_BASED_OUTPATIENT_CLINIC_OR_DEPARTMENT_OTHER): Payer: Self-pay

## 2023-09-23 ENCOUNTER — Emergency Department (HOSPITAL_BASED_OUTPATIENT_CLINIC_OR_DEPARTMENT_OTHER)
Admission: EM | Admit: 2023-09-23 | Discharge: 2023-09-23 | Disposition: A | Discharge status: Home / Self Care | Payer: BC Managed Care – PPO | Source: Home / Self Care | Attending: Emergency Medicine | Admitting: Emergency Medicine

## 2023-09-23 DIAGNOSIS — R17 Unspecified jaundice: Secondary | ICD-10-CM | POA: Diagnosis not present

## 2023-09-23 DIAGNOSIS — E041 Nontoxic single thyroid nodule: Secondary | ICD-10-CM | POA: Diagnosis not present

## 2023-09-23 DIAGNOSIS — D696 Thrombocytopenia, unspecified: Secondary | ICD-10-CM | POA: Diagnosis not present

## 2023-09-23 DIAGNOSIS — Z6824 Body mass index (BMI) 24.0-24.9, adult: Secondary | ICD-10-CM | POA: Diagnosis not present

## 2023-09-23 DIAGNOSIS — D638 Anemia in other chronic diseases classified elsewhere: Secondary | ICD-10-CM | POA: Diagnosis not present

## 2023-09-23 DIAGNOSIS — R112 Nausea with vomiting, unspecified: Secondary | ICD-10-CM | POA: Diagnosis not present

## 2023-09-23 DIAGNOSIS — Z885 Allergy status to narcotic agent status: Secondary | ICD-10-CM | POA: Diagnosis not present

## 2023-09-23 DIAGNOSIS — A09 Infectious gastroenteritis and colitis, unspecified: Secondary | ICD-10-CM | POA: Diagnosis not present

## 2023-09-23 DIAGNOSIS — Z9049 Acquired absence of other specified parts of digestive tract: Secondary | ICD-10-CM | POA: Diagnosis not present

## 2023-09-23 DIAGNOSIS — Z79899 Other long term (current) drug therapy: Secondary | ICD-10-CM | POA: Diagnosis not present

## 2023-09-23 DIAGNOSIS — D72829 Elevated white blood cell count, unspecified: Secondary | ICD-10-CM | POA: Insufficient documentation

## 2023-09-23 DIAGNOSIS — Z8249 Family history of ischemic heart disease and other diseases of the circulatory system: Secondary | ICD-10-CM | POA: Diagnosis not present

## 2023-09-23 DIAGNOSIS — K529 Noninfective gastroenteritis and colitis, unspecified: Secondary | ICD-10-CM | POA: Insufficient documentation

## 2023-09-23 DIAGNOSIS — K5900 Constipation, unspecified: Secondary | ICD-10-CM | POA: Diagnosis not present

## 2023-09-23 DIAGNOSIS — I10 Essential (primary) hypertension: Secondary | ICD-10-CM | POA: Diagnosis not present

## 2023-09-23 DIAGNOSIS — Z7952 Long term (current) use of systemic steroids: Secondary | ICD-10-CM | POA: Diagnosis not present

## 2023-09-23 DIAGNOSIS — E876 Hypokalemia: Secondary | ICD-10-CM | POA: Diagnosis not present

## 2023-09-23 DIAGNOSIS — E440 Moderate protein-calorie malnutrition: Secondary | ICD-10-CM | POA: Diagnosis not present

## 2023-09-23 DIAGNOSIS — K5000 Crohn's disease of small intestine without complications: Secondary | ICD-10-CM | POA: Diagnosis not present

## 2023-09-23 DIAGNOSIS — E860 Dehydration: Secondary | ICD-10-CM | POA: Diagnosis not present

## 2023-09-23 DIAGNOSIS — Z87891 Personal history of nicotine dependence: Secondary | ICD-10-CM | POA: Diagnosis not present

## 2023-09-23 LAB — CBC WITH DIFFERENTIAL/PLATELET
Abs Immature Granulocytes: 0.04 10*3/uL (ref 0.00–0.07)
Basophils Absolute: 0 10*3/uL (ref 0.0–0.1)
Basophils Relative: 0 %
Eosinophils Absolute: 0 10*3/uL (ref 0.0–0.5)
Eosinophils Relative: 0 %
HCT: 35.8 % — ABNORMAL LOW (ref 39.0–52.0)
Hemoglobin: 12.7 g/dL — ABNORMAL LOW (ref 13.0–17.0)
Immature Granulocytes: 0 %
Lymphocytes Relative: 5 %
Lymphs Abs: 0.6 10*3/uL — ABNORMAL LOW (ref 0.7–4.0)
MCH: 32.6 pg (ref 26.0–34.0)
MCHC: 35.5 g/dL (ref 30.0–36.0)
MCV: 92 fL (ref 80.0–100.0)
Monocytes Absolute: 0.6 10*3/uL (ref 0.1–1.0)
Monocytes Relative: 5 %
Neutro Abs: 10.8 10*3/uL — ABNORMAL HIGH (ref 1.7–7.7)
Neutrophils Relative %: 90 %
Platelets: 164 10*3/uL (ref 150–400)
RBC: 3.89 MIL/uL — ABNORMAL LOW (ref 4.22–5.81)
RDW: 16.3 % — ABNORMAL HIGH (ref 11.5–15.5)
WBC: 12.1 10*3/uL — ABNORMAL HIGH (ref 4.0–10.5)
nRBC: 0 % (ref 0.0–0.2)

## 2023-09-23 LAB — COMPREHENSIVE METABOLIC PANEL
ALT: 17 U/L (ref 0–44)
AST: 21 U/L (ref 15–41)
Albumin: 5 g/dL (ref 3.5–5.0)
Alkaline Phosphatase: 53 U/L (ref 38–126)
Anion gap: 10 (ref 5–15)
BUN: 12 mg/dL (ref 6–20)
CO2: 25 mmol/L (ref 22–32)
Calcium: 9.9 mg/dL (ref 8.9–10.3)
Chloride: 103 mmol/L (ref 98–111)
Creatinine, Ser: 0.96 mg/dL (ref 0.61–1.24)
GFR, Estimated: >60 mL/min (ref >60)
Glucose, Bld: 99 mg/dL (ref 70–99)
Potassium: 3.7 mmol/L (ref 3.5–5.1)
Sodium: 138 mmol/L (ref 135–145)
Total Bilirubin: 1.9 mg/dL — ABNORMAL HIGH (ref 0.3–1.2)
Total Protein: 7.4 g/dL (ref 6.5–8.1)

## 2023-09-23 LAB — LIPASE, BLOOD: Lipase: <10 U/L — ABNORMAL LOW (ref 11–51)

## 2023-09-23 MED ORDER — PREDNISONE 10 MG PO TABS: 40.0000 mg | ORAL_TABLET | Freq: Every day | ORAL | 0 refills | Status: AC

## 2023-09-23 MED ORDER — ONDANSETRON HCL 4 MG PO TABS: 4.0000 mg | ORAL_TABLET | Freq: Four times a day (QID) | ORAL | 0 refills | Status: AC

## 2023-09-23 MED ORDER — MORPHINE SULFATE (PF) 4 MG/ML IV SOLN
4.0000 mg | Freq: Once | INTRAVENOUS | Status: AC
  Administered 2023-09-23: 4 mg via INTRAVENOUS
  Filled 2023-09-23: qty 1

## 2023-09-23 MED ORDER — ONDANSETRON HCL 4 MG/2ML IJ SOLN
4.0000 mg | Freq: Once | INTRAMUSCULAR | Status: AC
  Administered 2023-09-23: 4 mg via INTRAVENOUS
  Filled 2023-09-23: qty 2

## 2023-09-23 MED ORDER — IOHEXOL 300 MG/ML  SOLN
100.0000 mL | Freq: Once | INTRAMUSCULAR | Status: AC | PRN
  Administered 2023-09-23: 100 mL via INTRAVENOUS

## 2023-09-23 MED ORDER — PREDNISONE 10 MG PO TABS
60.0000 mg | ORAL_TABLET | Freq: Once | ORAL | Status: AC
  Administered 2023-09-23: 60 mg via ORAL
  Filled 2023-09-23: qty 1

## 2023-09-23 NOTE — ED Notes (Signed)
Pt discharged home after verbalizing understanding of discharge instructions; nad noted. 

## 2023-09-23 NOTE — ED Notes (Signed)
Pt from home with n/v/d since early this morning. Pt has hx of Crohn's disease. Pt alert & oriented, nad noted.

## 2023-09-23 NOTE — Discharge Instructions (Signed)
You were seen in the emergency department for your nausea, vomiting and diarrhea.  You had no signs of severe dehydration.  Your CAT scan did show that you have colitis.  It is unclear whether this is caused from a viral infection or from your Crohn's so we have given you a short course of steroids.  You should complete this as prescribed.  She make sure that you are drinking plenty of fluids and I would do a clear liquid diet for the next day and slowly start to increase back to a normal diet.  You can take Tylenol or Motrin as needed for pain and Zofran as needed for nausea.  You should avoid any over-the-counter antidiarrheals.  You should follow-up with your GI doctor in the next few days to have your symptoms rechecked.  You should return to the emergency department if you are having continued fevers, significantly worsening pain, repetitive vomiting despite the nausea medicine or any other new or concerning symptoms.

## 2023-09-23 NOTE — ED Provider Notes (Signed)
[place] EMERGENCY DE[facility]MENT AT [facility] Provider Note   CSN: [id number] Arrival date & time: 09/23/23  1637     History  Chief Complaint  Patient presents with   Emesis    Nathan Lamb. is a 32 y.o. male.  Patient is a 32 year old male with a past medical history of Crohn's disease, prior appendicitis complicated by ileostomy that has now been reversed presenting to the emergency department with nausea, vomiting and diarrhea.  Patient states that he woke up around 5 AM this morning with his symptoms.  He states that he is generalized abdominal cramping.  He states that he felt feverish and chilled at home.  He denies any dysuria or hematuria, black or bloody stools.  Of note he states that his Crohn's has been controlled for the last 4 to 5 years.  The history is provided by the patient.  Emesis      Home Medications Prior to Admission medications   Medication Sig Start Date End Date Taking? Authorizing Provider  ondansetron (ZOFRAN) 4 MG tablet Take 1 tablet (4 mg total) by mouth every 6 (six) hours. 09/23/23  Yes Theresia Lo, Turkey K, DO  predniSONE (DELTASONE) 10 MG tablet Take 4 tablets (40 mg total) by mouth daily for 4 days. 09/23/23 09/27/23 Yes Kingsley, Benetta Spar K, DO  Adalimumab (HUMIRA PEN) 40 MG/0.8ML PNKT Inject into the skin.    [provider]  amoxicillin-clavulanate (AUGMENTIN) 875-125 MG tablet Take 1 tablet by mouth every 12 (twelve) hours. 02/07/23   Merrilee Jansky, MD  ibuprofen (ADVIL) 600 MG tablet Take 1 tablet (600 mg total) by mouth every 6 (six) hours as needed. 02/07/23   Lamptey, Britta Mccreedy, MD      Allergies    Dilaudid [hydromorphone hcl]    Review of Systems   Review of Systems  Gastrointestinal:  Positive for vomiting.    Physical Exam Updated Vital Signs BP 122/67   Pulse 63   Temp 99 F (37.2 C)   Resp 18   SpO2 100%  Physical Exam Vitals and nursing note reviewed.  Constitutional:      General:  He is not in acute distress.    Appearance: Normal appearance.  HENT:     Head: Normocephalic and atraumatic.     Nose: Nose normal.     Mouth/Throat:     Mouth: Mucous membranes are moist.  Eyes:     Extraocular Movements: Extraocular movements intact.     Conjunctiva/sclera: Conjunctivae normal.  Cardiovascular:     Rate and Rhythm: Normal rate and regular rhythm.     Heart sounds: Normal heart sounds.  Pulmonary:     Effort: Pulmonary effort is normal.     Breath sounds: Normal breath sounds.  Abdominal:     General: Abdomen is flat. Bowel sounds are normal.     Palpations: Abdomen is soft.     Tenderness: There is abdominal tenderness (Mild diffuse). There is no guarding or rebound.  Musculoskeletal:        General: Normal range of motion.     Cervical back: Normal range of motion.  Skin:    General: Skin is warm and dry.  Neurological:     General: No focal deficit present.     Mental Status: He is alert and oriented to person, place, and time.  Psychiatric:        Mood and Affect: Mood normal.        Behavior: Behavior normal.  ED Results / Procedures / Treatments   Labs (all labs ordered are listed, but only abnormal results are displayed) Labs Reviewed  COMPREHENSIVE METABOLIC PANEL - Abnormal; Notable for the following components:      Result Value   Total Bilirubin 1.9 (*)    All other components within normal limits  CBC WITH DIFFERENTIAL/PLATELET - Abnormal; Notable for the following components:   WBC 12.1 (*)    RBC 3.89 (*)    Hemoglobin 12.7 (*)    HCT 35.8 (*)    RDW 16.3 (*)    Neutro Abs 10.8 (*)    Lymphs Abs 0.6 (*)    All other components within normal limits  LIPASE, BLOOD - Abnormal; Notable for the following components:   Lipase <10 (*)    All other components within normal limits  URINALYSIS, ROUTINE W REFLEX MICROSCOPIC    EKG None  Radiology CT ABDOMEN PELVIS W CONTRAST  Result Date: 09/23/2023 CLINICAL DATA:  Nausea and  vomiting. EXAM: CT ABDOMEN AND PELVIS WITH CONTRAST TECHNIQUE: Multidetector CT imaging of the abdomen and pelvis was performed using the standard protocol following bolus administration of intravenous contrast. RADIATION DOSE REDUCTION: This exam was performed according to the departmental dose-optimization program which includes automated exposure control, adjustment of the mA and/or kV according to patient size and/or use of iterative reconstruction technique. CONTRAST:  OMNIPAQUE IOHEXOL 300 MG/ML  SOLN COMPARISON:  February 02, 2012 FINDINGS: Lower chest: No acute abnormality. Hepatobiliary: No focal liver abnormality is seen. No gallstones, gallbladder wall thickening, or biliary dilatation. Pancreas: Unremarkable. No pancreatic ductal dilatation or surrounding inflammatory changes. Spleen: Normal in size without focal abnormality. Adrenals/Urinary Tract: Adrenal glands are unremarkable. Kidneys are normal, without renal calculi, focal lesion, or hydronephrosis. Bladder is unremarkable. Stomach/Bowel: Stomach is within normal limits. Surgically anastomosed bowel is seen within the anterior aspect of the mid right abdomen. The appendix is surgically absent. No evidence of bowel dilatation. Moderate to markedly thickened large bowel is seen involving the distal transverse colon, descending colon and sigmoid colon. Vascular/Lymphatic: No significant vascular findings are present. No enlarged abdominal or pelvic lymph nodes. Reproductive: Prostate is unremarkable. Other: No abdominal wall hernia or abnormality. No abdominopelvic ascites. Musculoskeletal: No acute or significant osseous findings. IMPRESSION: 1. Moderate to marked severity colitis involving the distal transverse colon, descending colon and sigmoid colon. 2. Evidence of prior appendectomy and prior bowel anastomosis. Electronically Signed   By: Aram Candela M.D.   On: 09/23/2023 19:59    Procedures Procedures    Medications Ordered in  ED Medications  morphine (PF) 4 MG/ML injection 4 mg (4 mg Intravenous Given 09/23/23 1850)  ondansetron (ZOFRAN) injection 4 mg (4 mg Intravenous Given 09/23/23 1848)  iohexol (OMNIPAQUE) 300 MG/ML solution 100 mL (100 mLs Intravenous Contrast Given 09/23/23 1856)  predniSONE (DELTASONE) tablet 60 mg (60 mg Oral Given 09/23/23 2100)    ED Course/ Medical Decision Making/ A&P Clinical Course as of 09/23/23 2101  Thu Sep 23, 2023  2043 CTAP with acute colitis, concern for Crohn's flare and will be treated with steroid course. [VK]    Clinical Course User Index [VK] Rexford Maus, DO                                 Medical Decision Making This patient presents to the ED with chief complaint(s) of N/V/D with pertinent past medical history of Crohn's disease which further  complicates the presenting complaint. The complaint involves an extensive differential diagnosis and also carries with it a high risk of complications and morbidity.    The differential diagnosis includes gastroenteritis, gastritis, GERD, pancreatitis, cholelithiasis, cholecystitis, Crohn's flare, other intra-abdominal infection, dehydration, electrolyte abnormality  Additional history obtained: Additional history obtained from family Records reviewed outpatient GI records  ED Course and Reassessment: On patient's arrival he is hemodynamically stable in no acute distress.  The patient will have labs and CT scan performed with his history of Crohn's and will be treated with pain and nausea control and will be closely reassessed.  Independent labs interpretation:  The following labs were independently interpreted: Mild leukocytosis otherwise within normal range  Independent visualization of imaging: - I independently visualized the following imaging with scope of interpretation limited to determining acute life threatening conditions related to emergency care: CT AP, which revealed colitis  Consultation: -  Consulted or discussed management/test interpretation w/ external professional: N/A  Consideration for admission or further workup: Patient has no emergent conditions requiring admission or further work-up at this time and is stable for discharge home with GI follow-up  Social Determinants of health: N/A    Amount and/or Complexity of Data Reviewed Labs: ordered. Radiology: ordered.  Risk Prescription drug management.          Final Clinical Impression(s) / ED Diagnoses Final diagnoses:  Colitis  Nausea and vomiting, unspecified vomiting type    Rx / DC Orders ED Discharge Orders          Ordered    predniSONE (DELTASONE) 10 MG tablet  Daily        09/23/23 2059    ondansetron (ZOFRAN) 4 MG tablet  Every 6 hours        09/23/23 2059              Rexford Maus, DO 09/23/23 2101

## 2023-09-23 NOTE — ED Triage Notes (Signed)
He c/o numerous episodes of n/v since 0400 today, which persist. He is ambulatory and in no distress.

## 2023-09-25 ENCOUNTER — Inpatient Hospital Stay (HOSPITAL_COMMUNITY)
Admission: EM | Admit: 2023-09-25 | Discharge: 2023-09-27 | DRG: 392 | Disposition: A | Discharge status: Home / Self Care | Payer: BC Managed Care – PPO | Attending: Internal Medicine | Admitting: Internal Medicine

## 2023-09-25 ENCOUNTER — Other Ambulatory Visit: Payer: Self-pay

## 2023-09-25 ENCOUNTER — Encounter (HOSPITAL_COMMUNITY): Payer: Self-pay

## 2023-09-25 DIAGNOSIS — E041 Nontoxic single thyroid nodule: Secondary | ICD-10-CM | POA: Diagnosis present

## 2023-09-25 DIAGNOSIS — I10 Essential (primary) hypertension: Secondary | ICD-10-CM | POA: Diagnosis present

## 2023-09-25 DIAGNOSIS — Z7952 Long term (current) use of systemic steroids: Secondary | ICD-10-CM | POA: Diagnosis not present

## 2023-09-25 DIAGNOSIS — D696 Thrombocytopenia, unspecified: Secondary | ICD-10-CM | POA: Diagnosis present

## 2023-09-25 DIAGNOSIS — D638 Anemia in other chronic diseases classified elsewhere: Secondary | ICD-10-CM | POA: Diagnosis present

## 2023-09-25 DIAGNOSIS — Z8249 Family history of ischemic heart disease and other diseases of the circulatory system: Secondary | ICD-10-CM | POA: Diagnosis not present

## 2023-09-25 DIAGNOSIS — A09 Infectious gastroenteritis and colitis, unspecified: Secondary | ICD-10-CM | POA: Diagnosis present

## 2023-09-25 DIAGNOSIS — K50118 Crohn's disease of large intestine with other complication: Principal | ICD-10-CM

## 2023-09-25 DIAGNOSIS — Z87891 Personal history of nicotine dependence: Secondary | ICD-10-CM | POA: Diagnosis not present

## 2023-09-25 DIAGNOSIS — Z9049 Acquired absence of other specified parts of digestive tract: Secondary | ICD-10-CM | POA: Diagnosis not present

## 2023-09-25 DIAGNOSIS — E876 Hypokalemia: Secondary | ICD-10-CM | POA: Diagnosis present

## 2023-09-25 DIAGNOSIS — Z6824 Body mass index (BMI) 24.0-24.9, adult: Secondary | ICD-10-CM

## 2023-09-25 DIAGNOSIS — Z885 Allergy status to narcotic agent status: Secondary | ICD-10-CM

## 2023-09-25 DIAGNOSIS — K5900 Constipation, unspecified: Secondary | ICD-10-CM | POA: Diagnosis not present

## 2023-09-25 DIAGNOSIS — K529 Noninfective gastroenteritis and colitis, unspecified: Principal | ICD-10-CM | POA: Diagnosis present

## 2023-09-25 DIAGNOSIS — Z79899 Other long term (current) drug therapy: Secondary | ICD-10-CM | POA: Diagnosis not present

## 2023-09-25 DIAGNOSIS — E860 Dehydration: Secondary | ICD-10-CM | POA: Diagnosis present

## 2023-09-25 DIAGNOSIS — E440 Moderate protein-calorie malnutrition: Secondary | ICD-10-CM | POA: Diagnosis present

## 2023-09-25 DIAGNOSIS — R17 Unspecified jaundice: Secondary | ICD-10-CM | POA: Diagnosis present

## 2023-09-25 DIAGNOSIS — K5000 Crohn's disease of small intestine without complications: Secondary | ICD-10-CM | POA: Diagnosis present

## 2023-09-25 DIAGNOSIS — A419 Sepsis, unspecified organism: Principal | ICD-10-CM | POA: Diagnosis present

## 2023-09-25 LAB — URINALYSIS, ROUTINE W REFLEX MICROSCOPIC
Bilirubin Urine: NEGATIVE
Glucose, UA: NEGATIVE mg/dL
Hgb urine dipstick: NEGATIVE
Ketones, ur: 20 mg/dL — AB
Leukocytes,Ua: NEGATIVE
Nitrite: NEGATIVE
Protein, ur: NEGATIVE mg/dL
Specific Gravity, Urine: 1.016 (ref 1.005–1.030)
pH: 5 (ref 5.0–8.0)

## 2023-09-25 LAB — COMPREHENSIVE METABOLIC PANEL
ALT: 22 U/L (ref 0–44)
AST: 26 U/L (ref 15–41)
Albumin: 4.6 g/dL (ref 3.5–5.0)
Alkaline Phosphatase: 54 U/L (ref 38–126)
Anion gap: 12 (ref 5–15)
BUN: 11 mg/dL (ref 6–20)
CO2: 22 mmol/L (ref 22–32)
Calcium: 9.7 mg/dL (ref 8.9–10.3)
Chloride: 106 mmol/L (ref 98–111)
Creatinine, Ser: 1.15 mg/dL (ref 0.61–1.24)
GFR, Estimated: >60 mL/min (ref >60)
Glucose, Bld: 106 mg/dL — ABNORMAL HIGH (ref 70–99)
Potassium: 3.3 mmol/L — ABNORMAL LOW (ref 3.5–5.1)
Sodium: 140 mmol/L (ref 135–145)
Total Bilirubin: 2 mg/dL — ABNORMAL HIGH (ref 0.3–1.2)
Total Protein: 7.3 g/dL (ref 6.5–8.1)

## 2023-09-25 LAB — CBC
HCT: 36.6 % — ABNORMAL LOW (ref 39.0–52.0)
Hemoglobin: 12.9 g/dL — ABNORMAL LOW (ref 13.0–17.0)
MCH: 32.3 pg (ref 26.0–34.0)
MCHC: 35.2 g/dL (ref 30.0–36.0)
MCV: 91.5 fL (ref 80.0–100.0)
Platelets: 163 10*3/uL (ref 150–400)
RBC: 4 MIL/uL — ABNORMAL LOW (ref 4.22–5.81)
RDW: 15.9 % — ABNORMAL HIGH (ref 11.5–15.5)
WBC: 13.1 10*3/uL — ABNORMAL HIGH (ref 4.0–10.5)
nRBC: 0 % (ref 0.0–0.2)

## 2023-09-25 LAB — LIPASE, BLOOD: Lipase: 23 U/L (ref 11–51)

## 2023-09-25 MED ORDER — ONDANSETRON HCL 4 MG PO TABS: 4.0000 mg | ORAL_TABLET | Freq: Four times a day (QID) | ORAL | Status: DC | PRN

## 2023-09-25 MED ORDER — MORPHINE SULFATE (PF) 2 MG/ML IV SOLN
2.0000 mg | INTRAVENOUS | Status: DC | PRN
  Administered 2023-09-25 (×2): 2 mg via INTRAVENOUS
  Filled 2023-09-25 (×2): qty 1

## 2023-09-25 MED ORDER — SODIUM CHLORIDE 0.9 % IV SOLN
8.0000 mg | Freq: Three times a day (TID) | INTRAVENOUS | Status: DC
  Administered 2023-09-25 – 2023-09-27 (×6): 8 mg via INTRAVENOUS
  Filled 2023-09-25 (×11): qty 4

## 2023-09-25 MED ORDER — POTASSIUM CHLORIDE 10 MEQ/100ML IV SOLN
10.0000 meq | INTRAVENOUS | Status: AC
  Administered 2023-09-26 (×4): 10 meq via INTRAVENOUS
  Filled 2023-09-25 (×4): qty 100

## 2023-09-25 MED ORDER — SENNOSIDES-DOCUSATE SODIUM 8.6-50 MG PO TABS: 1.0000 | ORAL_TABLET | Freq: Every evening | ORAL | Status: DC | PRN

## 2023-09-25 MED ORDER — PIPERACILLIN-TAZOBACTAM 3.375 G IVPB
3.3750 g | Freq: Three times a day (TID) | INTRAVENOUS | Status: DC
  Administered 2023-09-25 – 2023-09-27 (×6): 3.375 g via INTRAVENOUS
  Filled 2023-09-25 (×6): qty 50

## 2023-09-25 MED ORDER — ACETAMINOPHEN 650 MG RE SUPP: 650.0000 mg | Freq: Four times a day (QID) | RECTAL | Status: DC | PRN

## 2023-09-25 MED ORDER — SODIUM CHLORIDE 0.9 % IV BOLUS
1000.0000 mL | Freq: Once | INTRAVENOUS | Status: AC
  Administered 2023-09-25: 1000 mL via INTRAVENOUS

## 2023-09-25 MED ORDER — ONDANSETRON HCL 4 MG/2ML IJ SOLN: 4.0000 mg | Freq: Four times a day (QID) | INTRAMUSCULAR | Status: DC | PRN

## 2023-09-25 MED ORDER — ACETAMINOPHEN 325 MG PO TABS: 650.0000 mg | ORAL_TABLET | Freq: Four times a day (QID) | ORAL | Status: DC | PRN

## 2023-09-25 MED ORDER — ENOXAPARIN SODIUM 40 MG/0.4ML IJ SOSY
40.0000 mg | PREFILLED_SYRINGE | INTRAMUSCULAR | Status: DC
  Administered 2023-09-25 – 2023-09-26 (×2): 40 mg via SUBCUTANEOUS
  Filled 2023-09-25 (×2): qty 0.4

## 2023-09-25 NOTE — ED Provider Triage Note (Signed)
Emergency Medicine Provider Triage Evaluation Note  Nathan Lamb. , a 32 y.o. male  was evaluated in triage.  Pt complains of abdominal pain and vomiting.  History of Crohn's disease.  Was seen at [facility] emergency department a few days ago and started on prednisone and Zofran.  Was advised by his primary care doctor to discontinue the prednisone and was started on Cipro Flagyl today.  Has not been able to take any doses of these antibiotics today.  Denies any fevers but is having chills.  Unable to keep any liquids down.  Abdominal pain is primarily on the right side of the abdomen.  Endorses some constipation.  Review of Systems  Positive: As above Negative: As above  Physical Exam  BP (!) 176/84 (BP Location: Right Arm)   Pulse 80   Temp 99.4 F (37.4 C) (Oral)   Resp 18   Ht 5\' 9"  (1.753 m)   Wt 74.4 kg   SpO2 100%   BMI 24.22 kg/m  Gen:   Awake, no distress Resp:  Normal effort  MSK:   Moves extremities without difficulty  Other:  Right side abdominal tenderness, decreased bowel sounds  Medical Decision Making  Medically screening exam initiated at 1:12 PM.  Appropriate orders placed.  Nathan Lamb. was informed that the remainder of the evaluation will be completed by another provider, this initial triage assessment does not replace that evaluation, and the importance of remaining in the ED until their evaluation is complete.     Smitty Knudsen, PA-C 09/25/23 1313

## 2023-09-25 NOTE — ED Provider Notes (Signed)
[place] EMERGENCY DEPARTMENT AT [facility] Provider Note   CSN: [id number] Arrival date & time: 09/25/23  1229     History  Chief Complaint  Patient presents with   Abdominal Pain   Emesis    Charon Akamine. is a 32 y.o. male history of Crohn's disease here presenting with abdominal pain.  Patient had abdominal pain and went to [facility] 2 days ago.  Patient had a CT abdomen pelvis that showed colitis.  Patient was thought to have Crohn's flare and was started on prednisone.  Patient had worsening nausea vomiting when he woke up this morning.  Patient called Dr. Elnoria Howard and patient was sent in for evaluation.  Patient actually was seen by Dr. Elnoria Howard in the ED and he recommended IV antibiotics and admission to the medicine service.  The history is provided by the patient.       Home Medications Prior to Admission medications   Medication Sig Start Date End Date Taking? Authorizing Provider  Adalimumab (HUMIRA PEN) 40 MG/0.8ML PNKT Inject 40 mg into the skin every 14 (fourteen) days. Last injection 09/24/2023   Yes [provider]  CIPRO 500 MG tablet Take 500 mg by mouth 2 (two) times daily. 09/25/23  Yes [provider]  metroNIDAZOLE (FLAGYL) 500 MG tablet Take 500 mg by mouth 3 (three) times daily. 09/25/23  Yes [provider]  ondansetron (ZOFRAN) 4 MG tablet Take 1 tablet (4 mg total) by mouth every 6 (six) hours. 09/23/23  Yes Theresia Lo, [place] K, DO  predniSONE (DELTASONE) 10 MG tablet Take 4 tablets (40 mg total) by mouth daily for 4 days. 09/23/23 09/27/23 Yes Elayne Snare K, DO      Allergies    Nsaids and Dilaudid [hydromorphone hcl]    Review of Systems   Review of Systems  Gastrointestinal:  Positive for abdominal pain and vomiting.  All other systems reviewed and are negative.   Physical Exam Updated Vital Signs BP 120/62   Pulse 73   Temp 98.6 F (37 C) (Oral)   Resp 12   Ht 5\' 9"  (1.753 m)   Wt 74.4 kg    SpO2 100%   BMI 24.22 kg/m  Physical Exam Vitals and nursing note reviewed.  Constitutional:      Comments: Slightly uncomfortable  HENT:     Head: Normocephalic.     Mouth/Throat:     Pharynx: Oropharynx is clear.  Eyes:     Extraocular Movements: Extraocular movements intact.     Pupils: Pupils are equal, round, and reactive to light.  Cardiovascular:     Rate and Rhythm: Normal rate and regular rhythm.     Heart sounds: Normal heart sounds.  Pulmonary:     Effort: Pulmonary effort is normal.     Breath sounds: Normal breath sounds.  Abdominal:     General: Abdomen is flat.     Comments: Mild diffuse tenderness but no rebound or guarding  Skin:    General: Skin is warm.  Neurological:     General: No focal deficit present.  Psychiatric:        Mood and Affect: Mood normal.        Behavior: Behavior normal.     ED Results / Procedures / Treatments   Labs (all labs ordered are listed, but only abnormal results are displayed) Labs Reviewed  COMPREHENSIVE METABOLIC PANEL - Abnormal; Notable for the following components:      Result Value   Potassium 3.3 (*)  Glucose, Bld 106 (*)    Total Bilirubin 2.0 (*)    All other components within normal limits  CBC - Abnormal; Notable for the following components:   WBC 13.1 (*)    RBC 4.00 (*)    Hemoglobin 12.9 (*)    HCT 36.6 (*)    RDW 15.9 (*)    All other components within normal limits  LIPASE, BLOOD  URINALYSIS, ROUTINE W REFLEX MICROSCOPIC    EKG None  Radiology CT ABDOMEN PELVIS W CONTRAST  Result Date: 09/23/2023 CLINICAL DATA:  Nausea and vomiting. EXAM: CT ABDOMEN AND PELVIS WITH CONTRAST TECHNIQUE: Multidetector CT imaging of the abdomen and pelvis was performed using the standard protocol following bolus administration of intravenous contrast. RADIATION DOSE REDUCTION: This exam was performed according to the departmental dose-optimization program which includes automated exposure control, adjustment  of the mA and/or kV according to patient size and/or use of iterative reconstruction technique. CONTRAST:  OMNIPAQUE IOHEXOL 300 MG/ML  SOLN COMPARISON:  February 02, 2012 FINDINGS: Lower chest: No acute abnormality. Hepatobiliary: No focal liver abnormality is seen. No gallstones, gallbladder wall thickening, or biliary dilatation. Pancreas: Unremarkable. No pancreatic ductal dilatation or surrounding inflammatory changes. Spleen: Normal in size without focal abnormality. Adrenals/Urinary Tract: Adrenal glands are unremarkable. Kidneys are normal, without renal calculi, focal lesion, or hydronephrosis. Bladder is unremarkable. Stomach/Bowel: Stomach is within normal limits. Surgically anastomosed bowel is seen within the anterior aspect of the mid right abdomen. The appendix is surgically absent. No evidence of bowel dilatation. Moderate to markedly thickened large bowel is seen involving the distal transverse colon, descending colon and sigmoid colon. Vascular/Lymphatic: No significant vascular findings are present. No enlarged abdominal or pelvic lymph nodes. Reproductive: Prostate is unremarkable. Other: No abdominal wall hernia or abnormality. No abdominopelvic ascites. Musculoskeletal: No acute or significant osseous findings. IMPRESSION: 1. Moderate to marked severity colitis involving the distal transverse colon, descending colon and sigmoid colon. 2. Evidence of prior appendectomy and prior bowel anastomosis. Electronically Signed   By: Aram Candela M.D.   On: 09/23/2023 19:59    Procedures Procedures    Medications Ordered in ED Medications  morphine (PF) 2 MG/ML injection 2 mg (2 mg Intravenous Given 09/25/23 1500)  piperacillin-tazobactam (ZOSYN) IVPB 3.375 g (3.375 g Intravenous New Bag/Given 09/25/23 1501)  ondansetron (ZOFRAN) 8 mg in sodium chloride 0.9 % 50 mL IVPB (0 mg Intravenous Stopped 09/25/23 1554)    ED Course/ Medical Decision Making/ A&P                                  Medical Decision Making Trypp Heckmann. is a 32 y.o. male here presenting with abdominal pain and vomiting.  Patient was thought to have Crohn's flare.  Patient was seen by GI earlier today who recommended IV antibiotics and fluids and pain control.  Will admit to the medicine service with GI consult.  5:24 PM Reviewed patient's labs and white blood cell count is 13.  Hospitalist to admit for colitis and intractable vomiting and dehydration  Problems Addressed: Colitis: acute illness or injury Dehydration: acute illness or injury  Amount and/or Complexity of Data Reviewed Labs: ordered. Decision-making details documented in ED Course.    Final Clinical Impression(s) / ED Diagnoses Final diagnoses:  None    Rx / DC Orders ED Discharge Orders     None         Chaney Malling  Hsienta, MD 09/25/23 1724

## 2023-09-25 NOTE — ED Triage Notes (Signed)
Patient was dx with colitis at [facility] on Thursday.  Reports has not been able to keep anything down and having fever and chills.  Hx of crohns.  Reports they put him on prednisone for crohns flare up but has got worse since leaving the hospital.

## 2023-09-25 NOTE — Consult Note (Signed)
Reason for Consult: Colitis, nausea, vomiting, and abdominal pain Referring Physician: [facility]  [name]. 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  His symptoms started acutely this past Thursday with some nausea and loose stools.  The symptoms waxed and waned, but he felt worse as the day progressed, overall.  Later that evening he presented to [facility] with his symptoms and a CT scan of the abdomen was performed.  It was negative for an ileal disease, but he was noted to have a moderate to severe colitis in the left side of the colon.  The ER thought that he had a flare of his Crohn's disease, even though he never had any colonic involvement with his Crohn's disease.  Morphine, ondansetron, and prednisone were administered.  He felt well enough to leave and the next day he continued to feel well.  After consulting with me over the phone, because he felt better, he was maintained on steroids.  Unfortunately this AM he felt worse and he was recommended to stop taking the prednisone.  Cipro and Flagyl were prescribed, but his mother called stating that he was not able to tolerate any PO.  This was around 12 PM.  He was advised to present to the ER for further evaluation and treatment.  Additionally, Nathan Lamb did report having some problems with chills and a low grade fever.  Upon presentation today he was noted to have a low grade temp at 99.4 F.  Prior to the onset of his symptoms he reported being in his usual state of health, which is being very well-controlled on Humira.  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, excised [id number] 04/04/2012   GERD (gastroesophageal reflux disease)    doesn't take any meds at present time   Seasonal  allergies    Thyroid nodule    watching by ultrasound    Past Surgical History:  Procedure Laterality Date   APPENDECTOMY  2007   COLON RESECTION  04/20/2012   Procedure: COLON RESECTION LAPAROSCOPIC;  Surgeon: Ardeth Sportsman, MD;  Location: [facility] OR;  Service: General;  Laterality: N/A;  laparoscopic assisted resection of stricture at old ileocolonic anastomosis   COLONOSCOPY     CREATION / REVISION OF ILEOSTOMY / JEJUNOSTOMY  10/2006   "for bowel obstruction that perforated"   LAPAROSCOPIC COLON RESECTION  04/20/12   w/lysis of adhesions   MECKEL DIVERTICULUM EXCISION  02/2007   RESECTION SMALL BOWEL / CLOSURE ILEOSTOMY  02/2007    Family History  Problem Relation Age of Onset   Hypertension Father    Anesthesia problems Neg Hx    Hypotension Neg Hx    Malignant hyperthermia Neg Hx    Pseudochol deficiency Neg Hx     Social History:  reports that he quit smoking about 11 years ago. His smoking use included cigarettes. He started smoking about 12 years ago. He smoked an average 0.1 packs per day for 0.5 years. He has never used smokeless tobacco. He reports current alcohol use. He reports current drug use. Drug: Marijuana.  Allergies:  Allergies  Allergen Reactions   Nsaids Other (See Comments)    Crohn's disease = NO NSAIDs   Dilaudid [Hydromorphone Hcl] Itching    Medications:  Scheduled: Continuous:  ondansetron (ZOFRAN) IV     piperacillin-tazobactam (ZOSYN)  IV      Results for orders placed or performed during the hospital encounter of 09/25/23 (from the past 24 hour(s))  Lipase, blood     Status: None   Collection Time: 09/25/23 12:53 PM  Result Value Ref Range   Lipase 23 11 - 51 U/L  Comprehensive metabolic panel     Status: Abnormal   Collection Time: 09/25/23 12:53 PM  Result Value Ref Range   Sodium 140 135 - 145 mmol/L   Potassium 3.3 (L) 3.5 - 5.1 mmol/L   Chloride 106 98 - 111 mmol/L   CO2 22 22 - 32 mmol/L   Glucose, Bld 106 (H) 70 - 99 mg/dL   BUN  11 6 - 20 mg/dL   Creatinine, Ser 6.29 0.61 - 1.24 mg/dL   Calcium 9.7 8.9 - 52.8 mg/dL   Total Protein 7.3 6.5 - 8.1 g/dL   Albumin 4.6 3.5 - 5.0 g/dL   AST 26 15 - 41 U/L   ALT 22 0 - 44 U/L   Alkaline Phosphatase 54 38 - 126 U/L   Total Bilirubin 2.0 (H) 0.3 - 1.2 mg/dL   GFR, Estimated >41 >32 mL/min   Anion gap 12 5 - 15  CBC     Status: Abnormal   Collection Time: 09/25/23 12:53 PM  Result Value Ref Range   WBC 13.1 (H) 4.0 - 10.5 K/uL   RBC 4.00 (L) 4.22 - 5.81 MIL/uL   Hemoglobin 12.9 (L) 13.0 - 17.0 g/dL   HCT 44.0 (L) 10.2 - 72.5 %   MCV 91.5 80.0 - 100.0 fL   MCH 32.3 26.0 - 34.0 pg   MCHC 35.2 30.0 - 36.0 g/dL   RDW 36.6 (H) 44.0 - 34.7 %   Platelets 163 150 - 400 K/uL   nRBC 0.0 0.0 - 0.2 %     CT ABDOMEN PELVIS W CONTRAST  Result Date: 09/23/2023 CLINICAL DATA:  Nausea and vomiting. EXAM: CT ABDOMEN AND PELVIS WITH CONTRAST TECHNIQUE: Multidetector CT imaging of the abdomen and pelvis was performed using the standard protocol following bolus administration of intravenous contrast. RADIATION DOSE REDUCTION: This exam was performed according to the departmental dose-optimization program which includes automated exposure control, adjustment of the mA and/or kV according to patient size and/or use of iterative reconstruction technique. CONTRAST:  OMNIPAQUE IOHEXOL 300 MG/ML  SOLN COMPARISON:  February 02, 2012 FINDINGS: Lower chest: No acute abnormality. Hepatobiliary: No focal liver abnormality is seen. No gallstones, gallbladder wall thickening, or biliary dilatation. Pancreas: Unremarkable. No pancreatic ductal dilatation or surrounding inflammatory changes. Spleen: Normal in size without focal abnormality. Adrenals/Urinary Tract: Adrenal glands are unremarkable. Kidneys are normal, without renal calculi, focal lesion, or hydronephrosis. Bladder is unremarkable. Stomach/Bowel: Stomach is within normal limits. Surgically anastomosed bowel is seen within the anterior aspect  of the mid right abdomen. The appendix is surgically absent. No evidence of bowel dilatation. Moderate to markedly thickened large bowel is seen involving the distal transverse colon, descending colon and sigmoid colon. Vascular/Lymphatic: No significant vascular findings are present. No enlarged abdominal or pelvic lymph nodes. Reproductive: Prostate is unremarkable. Other: No abdominal wall hernia or abnormality. No abdominopelvic ascites. Musculoskeletal: No acute or significant osseous findings. IMPRESSION: 1. Moderate to marked severity colitis involving the distal transverse colon, descending colon and sigmoid colon. 2. Evidence of prior appendectomy and prior bowel anastomosis. Electronically Signed   By: Demetrius Revel.D.  On: 09/23/2023 19:59    ROS:  As stated above in the HPI otherwise negative.  Blood pressure 133/79, pulse 62, temperature 98.4 F (36.9 C), temperature source Oral, resp. rate 11, height 5\' 9"  (1.753 m), weight 74.4 kg, SpO2 100%.    PE: Gen: Feeling poorly, Alert and Oriented, uncomfortable HEENT:  [place]/AT, EOMI Neck: Supple, no LAD Lungs: CTA Bilaterally CV: RRR without M/G/R ABD: Soft, right sided tenderness, +BS Ext: No C/C/E  Assessment/Plan: 1) Colitis. 2) Abdominal pain. 3) Nausea/vomiting. 4) History of ileal Crohn's disease s/p resection and ileo-colonic anastomosis.   This presentation is uncharacteristic of him as the vast majority of the time he remained in remission with his ileal Crohn's disease.  He does not report any sick contacts.  His WBC was elevated on 09/23/2023 and it remains elevated.  Nathan Lamb feels more constipated now than when he first started with symptoms.  He will be treated with IV antibiotics and closely monitored.  If he does not have any improvements, then endoscopic evaluation with biopsies will be required.  Plan: 1) Zosyn 3.375 g q8 hours. 2) Morphine 2 mg q3 hours PRN. 3) Ondansetron 8 mg q8 hours. 4) [facility]  to admit.  Anthony Tamburo D 09/25/2023, 2:56 PM

## 2023-09-25 NOTE — ED Notes (Signed)
ED TO INPATIENT HANDOFF REPORT  ED Nurse Name and Phone #: Delice Bison, RN  S Name/Age/Gender Nathan Lamb. 32 y.o. male Room/Bed: 028C/028C  Code Status   Code Status: Full Code  Home/SNF/Other Home Patient oriented to: self, place, time, and situation Is this baseline? Yes   Triage Complete: Triage complete  Chief Complaint Colitis [K52.9]  Triage Note Patient was dx with colitis at [facility] on Thursday.  Reports has not been able to keep anything down and having fever and chills.  Hx of crohns.  Reports they put him on prednisone for crohns flare up but has got worse since leaving the hospital.    Allergies Allergies  Allergen Reactions   Nsaids Other (See Comments)    Crohn's disease = NO NSAIDs   Dilaudid [Hydromorphone Hcl] Itching    Level of Care/Admitting Diagnosis ED Disposition     ED Disposition  Admit   Condition  --   Comment  Hospital Area: [facility] [[id number]]  Level of Care: Med-Surg [16]  May admit patient to [facility] or [facility] if equivalent level of care is available:: No  Covid Evaluation: Asymptomatic - no recent exposure (last 10 days) testing not required  Diagnosis: Colitis [161096]  Admitting Physician: Steffanie Rainwater [[id number]]  Attending Physician: Steffanie Rainwater [[id number]]  Certification:: I certify this patient will need inpatient services for at least 2 midnights  Expected Medical Readiness: 09/28/2023          B Medical/Surgery History 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, excised [id number] 04/04/2012   GERD (gastroesophageal reflux disease)    doesn't take any meds at present time   Seasonal allergies    Thyroid nodule    watching by ultrasound   Past Surgical History:  Procedure Laterality Date   APPENDECTOMY  2007   COLON RESECTION  04/20/2012   Procedure: COLON RESECTION  LAPAROSCOPIC;  Surgeon: Ardeth Sportsman, MD;  Location: [facility] OR;  Service: General;  Laterality: N/A;  laparoscopic assisted resection of stricture at old ileocolonic anastomosis   COLONOSCOPY     CREATION / REVISION OF ILEOSTOMY / JEJUNOSTOMY  10/2006   "for bowel obstruction that perforated"   LAPAROSCOPIC COLON RESECTION  04/20/12   w/lysis of adhesions   MECKEL DIVERTICULUM EXCISION  02/2007   RESECTION SMALL BOWEL / CLOSURE ILEOSTOMY  02/2007     A IV Location/Drains/Wounds Patient Lines/Drains/Airways Status     Active Line/Drains/Airways     Name Placement date Placement time Site Days   Peripheral IV 09/25/23 20 G Left;Posterior Hand 09/25/23  1427  Hand  less than 1   Incision - 3 Ports Abdomen 1: Left;Upper;Medial 2: Left;Mid;Lateral 3: Left;Lower;Medial 04/20/12  --  -- 4175            Intake/Output Last 24 hours No intake or output data in the 24 hours ending 09/25/23 1845  Labs/Imaging Results for orders placed or performed during the hospital encounter of 09/25/23 (from the past 48 hour(s))  Lipase, blood     Status: None   Collection Time: 09/25/23 12:53 PM  Result Value Ref Range   Lipase 23 11 - 51 U/L    Comment: Performed at [facility], [address] [place], [place] [address]  Comprehensive metabolic panel     Status: Abnormal   Collection Time: 09/25/23 12:53 PM  Result  Value Ref Range   Sodium 140 135 - 145 mmol/L   Potassium 3.3 (L) 3.5 - 5.1 mmol/L   Chloride 106 98 - 111 mmol/L   CO2 22 22 - 32 mmol/L   Glucose, Bld 106 (H) 70 - 99 mg/dL    Comment: Glucose reference range applies only to samples taken after fasting for at least 8 hours.   BUN 11 6 - 20 mg/dL   Creatinine, Ser 1.61 0.61 - 1.24 mg/dL   Calcium 9.7 8.9 - 09.6 mg/dL   Total Protein 7.3 6.5 - 8.1 g/dL   Albumin 4.6 3.5 - 5.0 g/dL   AST 26 15 - 41 U/L   ALT 22 0 - 44 U/L   Alkaline Phosphatase 54 38 - 126 U/L   Total Bilirubin 2.0 (H) 0.3 - 1.2 mg/dL   GFR, Estimated >04  >54 mL/min    Comment: (NOTE) Calculated using the CKD-EPI Creatinine Equation (2021)    Anion gap 12 5 - 15    Comment: Performed at [facility], [address] [place], [place] [address]  CBC     Status: Abnormal   Collection Time: 09/25/23 12:53 PM  Result Value Ref Range   WBC 13.1 (H) 4.0 - 10.5 K/uL   RBC 4.00 (L) 4.22 - 5.81 MIL/uL   Hemoglobin 12.9 (L) 13.0 - 17.0 g/dL   HCT 91.4 (L) 78.2 - 95.6 %   MCV 91.5 80.0 - 100.0 fL   MCH 32.3 26.0 - 34.0 pg   MCHC 35.2 30.0 - 36.0 g/dL   RDW 21.3 (H) 08.6 - 57.8 %   Platelets 163 150 - 400 K/uL    Comment: REPEATED TO VERIFY   nRBC 0.0 0.0 - 0.2 %    Comment: Performed at [facility], [address], [place], [place] [address]   CT ABDOMEN PELVIS W CONTRAST  Result Date: 09/23/2023 CLINICAL DATA:  Nausea and vomiting. EXAM: CT ABDOMEN AND PELVIS WITH CONTRAST TECHNIQUE: Multidetector CT imaging of the abdomen and pelvis was performed using the standard protocol following bolus administration of intravenous contrast. RADIATION DOSE REDUCTION: This exam was performed according to the departmental dose-optimization program which includes automated exposure control, adjustment of the mA and/or kV according to patient size and/or use of iterative reconstruction technique. CONTRAST:  OMNIPAQUE IOHEXOL 300 MG/ML  SOLN COMPARISON:  February 02, 2012 FINDINGS: Lower chest: No acute abnormality. Hepatobiliary: No focal liver abnormality is seen. No gallstones, gallbladder wall thickening, or biliary dilatation. Pancreas: Unremarkable. No pancreatic ductal dilatation or surrounding inflammatory changes. Spleen: Normal in size without focal abnormality. Adrenals/Urinary Tract: Adrenal glands are unremarkable. Kidneys are normal, without renal calculi, focal lesion, or hydronephrosis. Bladder is unremarkable. Stomach/Bowel: Stomach is within normal limits. Surgically anastomosed bowel is seen within the anterior aspect of the mid right  abdomen. The appendix is surgically absent. No evidence of bowel dilatation. Moderate to markedly thickened large bowel is seen involving the distal transverse colon, descending colon and sigmoid colon. Vascular/Lymphatic: No significant vascular findings are present. No enlarged abdominal or pelvic lymph nodes. Reproductive: Prostate is unremarkable. Other: No abdominal wall hernia or abnormality. No abdominopelvic ascites. Musculoskeletal: No acute or significant osseous findings. IMPRESSION: 1. Moderate to marked severity colitis involving the distal transverse colon, descending colon and sigmoid colon. 2. Evidence of prior appendectomy and prior bowel anastomosis. Electronically Signed   By: Aram Candela M.D.   On: 09/23/2023 19:59    Pending Labs [employer] (From admission,  onward)     Start     Ordered   09/25/23 1831  HIV Antibody (routine testing w rflx)  (HIV Antibody (Routine testing w reflex) panel)  Once,   R        09/25/23 1833   09/25/23 1247  Urinalysis, Routine w reflex microscopic -Urine, Clean Catch  Once,   URGENT       Question:  Specimen Source  Answer:  Urine, Clean Catch   09/25/23 1246            Vitals/Pain Today's Vitals   09/25/23 1700 09/25/23 1730 09/25/23 1800 09/25/23 1830  BP: 139/65 132/73 136/73 118/64  Pulse: 63 77 (!) 59 60  Resp: 11 11 11 10   Temp:      TempSrc:      SpO2: 100% 100% 100% 100%  Weight:      Height:      PainSc:        Isolation Precautions No active isolations  Medications Medications  morphine (PF) 2 MG/ML injection 2 mg (2 mg Intravenous Given 09/25/23 1500)  piperacillin-tazobactam (ZOSYN) IVPB 3.375 g (3.375 g Intravenous New Bag/Given 09/25/23 1501)  ondansetron (ZOFRAN) 8 mg in sodium chloride 0.9 % 50 mL IVPB (0 mg Intravenous Stopped 09/25/23 1554)  enoxaparin (LOVENOX) injection 40 mg (has no administration in time range)  acetaminophen (TYLENOL) tablet 650 mg (has no administration in time range)    Or   acetaminophen (TYLENOL) suppository 650 mg (has no administration in time range)  senna-docusate (Senokot-S) tablet 1 tablet (has no administration in time range)  ondansetron (ZOFRAN) tablet 4 mg (has no administration in time range)    Or  ondansetron (ZOFRAN) injection 4 mg (has no administration in time range)  sodium chloride 0.9 % bolus 1,000 mL (0 mLs Intravenous Stopped 09/25/23 1820)    Mobility walks     Focused Assessments Cardiac Assessment Handoff:    No results found for: "CKTOTAL", "CKMB", "CKMBINDEX", "TROPONINI" No results found for: "DDIMER" Does the Patient currently have chest pain? No    R Recommendations: See Admitting Provider Note  Report given to:   Additional Notes:

## 2023-09-25 NOTE — H&P (Addendum)
History and Physical    Patient: Nathan Lamb. WUJ:[id number] DOB: 1991-09-06 DOA: 09/25/2023 DOS: the patient was seen and examined on 09/25/2023 PCP: Pcp, No  Patient coming from: Home  Chief Complaint:  Chief Complaint  Patient presents with   Abdominal Pain   Emesis   HPI: Nathan Lamb. is a 32 y.o. male with medical history significant of small bowel Crohn's disease s/p ileal resection x 2 who presents to the ED for evaluation of abdominal pain, nausea and vomiting.  His symptoms started on Thursday with some nausea and vomiting as well as intermittent abdominal discomfort.  He was evaluated at [facility] and a CT A/P showed moderate to severe colitis in the left side of the colon.  He was treated with IV pain meds, antiemetics and prednisone and discharged home on steroids.  He felt better on Friday however this morning, he woke up with significant nausea and vomiting as well as abdominal pain.  His mother called his GI doctor who prescribed Cipro and Flagyl however patient was unable to tolerate any p.o. intake so he was advised by his GI doctor to present to the ED for further evaluation. He continues to have nausea, intermittent lower abdominal pain and some chills but denies any chest pain, shortness of breath, headache or bloody stools.  Reports feeling constipated but had a small bowel movement this morning.  ED course: Hypertensive with low-grade fever on admission. Labs significant for lipase 23, WBC 13.1, Hgb 12.9, K+ 3.3 and creatinine of 1.15.  Patient was evaluated by his GI provider, Dr. Elnoria Howard who recommended hospitalist admit for management of colitis with IV antibiotics.   Review of Systems: As mentioned in the history of present illness. All other systems reviewed and are negative. 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, excised [id number] 04/04/2012    GERD (gastroesophageal reflux disease)    doesn't take any meds at present time   Seasonal allergies    Thyroid nodule    watching by ultrasound   Past Surgical History:  Procedure Laterality Date   APPENDECTOMY  2007   COLON RESECTION  04/20/2012   Procedure: COLON RESECTION LAPAROSCOPIC;  Surgeon: Ardeth Sportsman, MD;  Location: [facility] OR;  Service: General;  Laterality: N/A;  laparoscopic assisted resection of stricture at old ileocolonic anastomosis   COLONOSCOPY     CREATION / REVISION OF ILEOSTOMY / JEJUNOSTOMY  10/2006   "for bowel obstruction that perforated"   LAPAROSCOPIC COLON RESECTION  04/20/12   w/lysis of adhesions   MECKEL DIVERTICULUM EXCISION  02/2007   RESECTION SMALL BOWEL / CLOSURE ILEOSTOMY  02/2007   Social History:  reports that he quit smoking about 11 years ago. His smoking use included cigarettes. He started smoking about 12 years ago. He smoked an average 0.1 packs per day for 0.5 years. He has never used smokeless tobacco. He reports current alcohol use. He reports current drug use. Drug: Marijuana.  Allergies  Allergen Reactions   Nsaids Other (See Comments)    Crohn's disease = NO NSAIDs   Dilaudid [Hydromorphone Hcl] Itching    Family History  Problem Relation Age of Onset   Hypertension Father    Anesthesia problems Neg Hx    Hypotension Neg Hx    Malignant hyperthermia Neg Hx    Pseudochol deficiency Neg Hx     Prior to Admission medications  Medication Sig Start Date End Date Taking? Authorizing Provider  Adalimumab (HUMIRA PEN) 40 MG/0.8ML PNKT Inject 40 mg into the skin every 14 (fourteen) days. Last injection 09/24/2023   Yes [provider]  CIPRO 500 MG tablet Take 500 mg by mouth 2 (two) times daily. 09/25/23  Yes [provider]  metroNIDAZOLE (FLAGYL) 500 MG tablet Take 500 mg by mouth 3 (three) times daily. 09/25/23  Yes [provider]  ondansetron (ZOFRAN) 4 MG tablet Take 1 tablet (4 mg total) by mouth every  6 (six) hours. 09/23/23  Yes Theresia Lo, [place] K, DO  predniSONE (DELTASONE) 10 MG tablet Take 4 tablets (40 mg total) by mouth daily for 4 days. 09/23/23 09/27/23 Yes Rexford Maus, DO    Physical Exam: Vitals:   09/25/23 1730 09/25/23 1800 09/25/23 1830 09/25/23 1937  BP: 132/73 136/73 118/64 132/78  Pulse: 77 (!) 59 60 60  Resp: 11 11 10 16   Temp:    99.4 F (37.4 C)  TempSrc:    Oral  SpO2: 100% 100% 100% 100%  Weight:      Height:      General: Pleasant, well-appearing young man laying in bed. No acute distress. Head: Normocephalic. Atraumatic. CV: RRR. No murmurs, rubs, or gallops. No LE edema Pulmonary: Lungs CTAB. Normal effort. No wheezing or rales. Abdominal: Soft, mild RLQ tenderness. Normal bowel sounds.  Healed surgical incision site. Extremities: Palpable radial and DP pulses. Normal ROM. Skin: Warm and dry. No obvious rash or lesions. Neuro: A&Ox3. Moves all extremities. Normal sensation. No focal deficit. Psych: Normal mood and affect  Data Reviewed:  Urinalysis pending  Assessment and Plan:  Nathan Valente. is a 32 y.o. male with medical history significant of small bowel Crohn's disease s/p ileal resection x 2 who presents to the ED for evaluation of abdominal pain, nausea and vomiting and admitted for colitis  # Colitis # Hx of small bowel Crohn's disease Young patient with a history of small bowel Crohn's disease status post ileal resection here for abdominal pain, nausea and vomiting and found to have colonic colitis. CT A/P on 10/31 showed moderate to severe colitis involving the distal transverse colon, descending colon and sigmoid colon. Patient reports that his current presentation is similar to his last Crohn's flare about 10 years ago however CT scan did not show any inflammation in the small bowel.  He has been unable to tolerate any p.o. intake so we will continue IV antibiotics with close monitoring of symptoms.  GI is following with plan  for endoscopic evaluation with biopsies if patient's symptoms does not improve with IV antibiotics. -GI following, appreciate recs -Continue IV Zosyn -As needed morphine for pain -As needed Zofran for nausea vomiting -Advance diet as tolerated -Patient is on Humira every 14 days with last injection on 11/1  # Hypokalemia Mild hypokalemia with K+ of 3.3 on admission likely secondary to nausea vomiting. -Replete with IV KCl 10 mEq x 4 hours -Follow-up morning CMP   Advance Care Planning:   Code Status: Full Code   Consults: Gastroenterology  Family Communication: Discussed admission with mother at bedside.  Severity of Illness: The appropriate patient status for this patient is INPATIENT. Inpatient status is judged to be reasonable and necessary in order to provide the required intensity of service to ensure the patient's safety. The patient's presenting symptoms, physical exam findings, and initial radiographic and laboratory data in the context of their chronic comorbidities is felt to place them at  high risk for further clinical deterioration. Furthermore, it is not anticipated that the patient will be medically stable for discharge from the hospital within 2 midnights of admission.   * I certify that at the point of admission it is my clinical judgment that the patient will require inpatient hospital care spanning beyond 2 midnights from the point of admission due to high intensity of service, high risk for further deterioration and high frequency of surveillance required.*  Author: Steffanie Rainwater, MD 09/25/2023 10:06 PM  For on call review [email or web address].

## 2023-09-26 DIAGNOSIS — K529 Noninfective gastroenteritis and colitis, unspecified: Secondary | ICD-10-CM | POA: Diagnosis not present

## 2023-09-26 LAB — COMPREHENSIVE METABOLIC PANEL
ALT: 22 U/L (ref 0–44)
AST: 18 U/L (ref 15–41)
Albumin: 3.4 g/dL — ABNORMAL LOW (ref 3.5–5.0)
Alkaline Phosphatase: 39 U/L (ref 38–126)
Anion gap: 8 (ref 5–15)
BUN: 7 mg/dL (ref 6–20)
CO2: 24 mmol/L (ref 22–32)
Calcium: 8.5 mg/dL — ABNORMAL LOW (ref 8.9–10.3)
Chloride: 107 mmol/L (ref 98–111)
Creatinine, Ser: 1.04 mg/dL (ref 0.61–1.24)
GFR, Estimated: >60 mL/min (ref >60)
Glucose, Bld: 83 mg/dL (ref 70–99)
Potassium: 3.5 mmol/L (ref 3.5–5.1)
Sodium: 139 mmol/L (ref 135–145)
Total Bilirubin: 1.3 mg/dL — ABNORMAL HIGH (ref 0.3–1.2)
Total Protein: 5.9 g/dL — ABNORMAL LOW (ref 6.5–8.1)

## 2023-09-26 LAB — CBC
HCT: 31.6 % — ABNORMAL LOW (ref 39.0–52.0)
Hemoglobin: 11 g/dL — ABNORMAL LOW (ref 13.0–17.0)
MCH: 32.6 pg (ref 26.0–34.0)
MCHC: 34.8 g/dL (ref 30.0–36.0)
MCV: 93.8 fL (ref 80.0–100.0)
Platelets: 128 10*3/uL — ABNORMAL LOW (ref 150–400)
RBC: 3.37 MIL/uL — ABNORMAL LOW (ref 4.22–5.81)
RDW: 15.6 % — ABNORMAL HIGH (ref 11.5–15.5)
WBC: 8 10*3/uL (ref 4.0–10.5)
nRBC: 0 % (ref 0.0–0.2)

## 2023-09-26 LAB — HIV ANTIBODY (ROUTINE TESTING W REFLEX): HIV Screen 4th Generation wRfx: NONREACTIVE

## 2023-09-26 NOTE — Plan of Care (Signed)

## 2023-09-26 NOTE — Progress Notes (Signed)
PROGRESS NOTE    Nathan Lamb.  KZS:[id number] DOB: June 12, 1991 DOA: 09/25/2023 PCP: Pcp, No   Brief Narrative:  Nathan Lamb. is a 32 y.o. male with medical history significant of small bowel Crohn's disease s/p ileal resection x 2 who presents to the ED for evaluation of abdominal pain, nausea and vomiting.   Assessment & Plan:   Principal Problem:   Colitis   Sepsis secondary to colitis, complicated by history of Crohn's disease status post ileal resection x 2. -Symptoms markedly well-controlled now -Advance diet as tolerated -Appreciate GI insight recommendations -Continue Zosyn, supportive care -Wean off narcotics as available -Patient is on Humira every 14 days with last injection on 11/1   Anemia, of chronic disease/malnutrition -Stable from baseline, likely secondary to Crohn's malnutrition and poor p.o. intake  Thrombocytopenia -Questionably acute phase reactant, follow repeat labs  Hyperbilirubinemia  -Isolated -possibly reactive, downtrending with supportive care  Hypokalemia Mild hypokalemia with K+ of 3.3 on admission likely secondary to nausea vomiting. Repeat labs within normal limits   DVT prophylaxis: enoxaparin (LOVENOX) injection 40 mg Start: 09/25/23 2200   Code Status:   Code Status: Full Code  Family Communication: At bedside  Status is: Inpatient  Dispo: The patient is from: Home              Anticipated d/c is to: Home              Anticipated d/c date is: 24 to 48 hours              Patient currently not medically stable for discharge  Consultants:  GI  Procedures:  None  Antimicrobials:  Zosyn  Subjective: No acute issues or events overnight, pain nausea vomiting appear to be resolving, advance diet as tolerated  Objective: Vitals:   09/25/23 1800 09/25/23 1830 09/25/23 1937 09/26/23 0442  BP: 136/73 118/64 132/78 114/69  Pulse: (!) 59 60 60 (!) 56  Resp: 11 10 16 18   Temp:   99.4 F (37.4 C) 98.6 F (37  C)  TempSrc:   Oral Oral  SpO2: 100% 100% 100% 100%  Weight:      Height:        Intake/Output Summary (Last 24 hours) at 09/26/2023 7322 Last data filed at 09/26/2023 0600 Gross per 24 hour  Intake 580.55 ml  Output --  Net 580.55 ml   Filed Weights   09/25/23 1244  Weight: 74.4 kg    Examination:  General:  Pleasantly resting in bed, No acute distress. HEENT:  Normocephalic atraumatic.  Sclerae nonicteric, noninjected.  Extraocular movements intact bilaterally. Neck:  Without mass or deformity.  Trachea is midline. Lungs:  Clear to auscultate bilaterally without rhonchi, wheeze, or rales. Heart:  Regular rate and rhythm.  Without murmurs, rubs, or gallops. Abdomen:  Soft, nontender, nondistended.  Without guarding or rebound. Extremities: Without cyanosis, clubbing, edema, or obvious deformity. Skin:  Warm and dry, no erythema.  Data Reviewed: I have personally reviewed following labs and imaging studies  CBC: Recent Labs  Lab 09/23/23 1741 09/25/23 1253 09/26/23 0341  WBC 12.1* 13.1* 8.0  NEUTROABS 10.8*  --   --   HGB 12.7* 12.9* 11.0*  HCT 35.8* 36.6* 31.6*  MCV 92.0 91.5 93.8  PLT 164 163 128*   Basic Metabolic Panel: Recent Labs  Lab 09/23/23 1741 09/25/23 1253 09/26/23 0341  NA 138 140 139  K 3.7 3.3* 3.5  CL 103 106 107  CO2 25 22 24  GLUCOSE 99 106* 83  BUN 12 11 7   CREATININE 0.96 1.15 1.04  CALCIUM 9.9 9.7 8.5*   GFR: Estimated Creatinine Clearance: 103.9 mL/min (by C-G formula based on SCr of 1.04 mg/dL). Liver Function Tests: Recent Labs  Lab 09/23/23 1741 09/25/23 1253 09/26/23 0341  AST 21 26 18   ALT 17 22 22   ALKPHOS 53 54 39  BILITOT 1.9* 2.0* 1.3*  PROT 7.4 7.3 5.9*  ALBUMIN 5.0 4.6 3.4*   Recent Labs  Lab 09/23/23 1741 09/25/23 1253  LIPASE <10* 23   No results for input(s): "AMMONIA" in the last 168 hours. Coagulation Profile: No results for input(s): "INR", "PROTIME" in the last 168 hours. Cardiac Enzymes: No  results for input(s): "CKTOTAL", "CKMB", "CKMBINDEX", "TROPONINI" in the last 168 hours. BNP (last 3 results) No results for input(s): "PROBNP" in the last 8760 hours. HbA1C: No results for input(s): "HGBA1C" in the last 72 hours. CBG: No results for input(s): "GLUCAP" in the last 168 hours. Lipid Profile: No results for input(s): "CHOL", "HDL", "LDLCALC", "TRIG", "CHOLHDL", "LDLDIRECT" in the last 72 hours. Thyroid Function Tests: No results for input(s): "TSH", "T4TOTAL", "FREET4", "T3FREE", "THYROIDAB" in the last 72 hours. Anemia Panel: No results for input(s): "VITAMINB12", "FOLATE", "FERRITIN", "TIBC", "IRON", "RETICCTPCT" in the last 72 hours. Sepsis Labs: No results for input(s): "PROCALCITON", "LATICACIDVEN" in the last 168 hours.  No results found for this or any previous visit (from the past 240 hour(s)).       Radiology Studies: No results found.      Scheduled Meds:  enoxaparin (LOVENOX) injection  40 mg Subcutaneous Q24H   Continuous Infusions:  ondansetron (ZOFRAN) IV 8 mg (09/26/23 0447)   piperacillin-tazobactam (ZOSYN)  IV 3.375 g (09/26/23 0518)     LOS: 1 day   Time spent:  Azucena Fallen, DO [facility]  If 7PM-7AM, please contact night-coverage [email or web address]  09/26/2023, 7:02 AM

## 2023-09-26 NOTE — Progress Notes (Signed)
Subjective: Feeling much better.    Objective: Vital signs in last 24 hours: Temp:  [98.4 F (36.9 C)-99.4 F (37.4 C)] 98.6 F (37 C) (11/03 0442) Pulse Rate:  [56-80] 56 (11/03 0442) Resp:  [10-18] 18 (11/03 0442) BP: (114-176)/(62-84) 114/69 (11/03 0442) SpO2:  [100 %] 100 % (11/03 0442) Weight:  [74.4 kg] 74.4 kg (11/02 1244)    Intake/Output from previous day: 11/02 0701 - 11/03 0700 In: 593 [IV Piggyback:593] Out: -  Intake/Output this shift: Total I/O In: 240 [P.O.:240] Out: -   General appearance: alert and no distress GI: soft, non-tender; bowel sounds normal; no masses,  no organomegaly  Lab Results: Recent Labs    09/23/23 1741 09/25/23 1253 09/26/23 0341  WBC 12.1* 13.1* 8.0  HGB 12.7* 12.9* 11.0*  HCT 35.8* 36.6* 31.6*  PLT 164 163 128*   BMET Recent Labs    09/23/23 1741 09/25/23 1253 09/26/23 0341  NA 138 140 139  K 3.7 3.3* 3.5  CL 103 106 107  CO2 25 22 24   GLUCOSE 99 106* 83  BUN 12 11 7   CREATININE 0.96 1.15 1.04  CALCIUM 9.9 9.7 8.5*   LFT Recent Labs    09/26/23 0341  PROT 5.9*  ALBUMIN 3.4*  AST 18  ALT 22  ALKPHOS 39  BILITOT 1.3*   PT/INR No results for input(s): "LABPROT", "INR" in the last 72 hours. Hepatitis Panel No results for input(s): "HEPBSAG", "HCVAB", "HEPAIGM", "HEPBIGM" in the last 72 hours. C-Diff No results for input(s): "CDIFFTOX" in the last 72 hours. Fecal Lactopherrin No results for input(s): "FECLLACTOFRN" in the last 72 hours.  Studies/Results: No results found.  Medications: Scheduled:  enoxaparin (LOVENOX) injection  40 mg Subcutaneous Q24H   Continuous:  ondansetron (ZOFRAN) IV 8 mg (09/26/23 0447)   piperacillin-tazobactam (ZOSYN)  IV 12.5 mL/hr at 09/26/23 0700    Assessment/Plan: 1) Infectious colitis. 2) Abdominal pain - resolved. 3) Nausea - resolved.   There is a marked improvement compared to yesterday.  He is very close to his baseline.  He only received two injections of  morphine with the last being in the evening on 09/25/2023.  Plan: 1) Maintain Zosyn. 2) Anticipate D/C tomorrow. 3) Transition to Augmentin upon discharge. 4) Advance diet as tolerated.  LOS: 1 day   Deveion Denz D 09/26/2023, 8:59 AM

## 2023-09-27 DIAGNOSIS — K529 Noninfective gastroenteritis and colitis, unspecified: Secondary | ICD-10-CM | POA: Diagnosis not present

## 2023-09-27 LAB — BASIC METABOLIC PANEL
Anion gap: 8 (ref 5–15)
BUN: <5 mg/dL — ABNORMAL LOW (ref 6–20)
CO2: 25 mmol/L (ref 22–32)
Calcium: 8.7 mg/dL — ABNORMAL LOW (ref 8.9–10.3)
Chloride: 106 mmol/L (ref 98–111)
Creatinine, Ser: 1.15 mg/dL (ref 0.61–1.24)
GFR, Estimated: >60 mL/min (ref >60)
Glucose, Bld: 103 mg/dL — ABNORMAL HIGH (ref 70–99)
Potassium: 3.2 mmol/L — ABNORMAL LOW (ref 3.5–5.1)
Sodium: 139 mmol/L (ref 135–145)

## 2023-09-27 MED ORDER — AMOXICILLIN-POT CLAVULANATE 875-125 MG PO TABS: 1.0000 | ORAL_TABLET | Freq: Two times a day (BID) | ORAL | 0 refills | Status: AC

## 2023-09-27 MED ORDER — POTASSIUM CHLORIDE CRYS ER 20 MEQ PO TBCR
40.0000 meq | EXTENDED_RELEASE_TABLET | Freq: Once | ORAL | Status: AC
  Administered 2023-09-27: 40 meq via ORAL
  Filled 2023-09-27: qty 2

## 2023-09-27 NOTE — TOC Transition Note (Signed)
Transition of Care [facility]) - CM/SW Discharge Note   Patient Details  Name: Nathan Lamb. MRN: [id number] Date of Birth: Mar 25, 1991  [facility] CM/SW Contact:  Tom-Johnson, Hershal Coria, RN Phone Number: 09/27/2023, 10:47 AM   Clinical Narrative:     Patient is scheduled for discharge today.  Readmission Risk Assessment done. New patient establishment, hospital f/u and discharge instructions on AVS. Patient's mother, Darnelle Catalan to transport at discharge.  No further TOC needs noted.       Final next level of care: Home/Self Care Barriers to Discharge: Barriers Resolved   Patient Goals and CMS Choice CMS Medicare.gov Compare Post Acute Care list provided to:: Patient Choice offered to / list presented to : NA  Discharge Placement                  Patient to be transferred to facility by: Mother Name of family member notified: [facility]    Discharge Plan and Services Additional resources added to the After Visit Summary for                  DME Arranged: N/A DME Agency: NA       HH Arranged: NA HH Agency: NA        Social Determinants of Health (SDOH) Interventions SDOH Screenings   Food Insecurity: No Food Insecurity (09/25/2023)  Housing: Low Risk  (09/25/2023)  Transportation Needs: No Transportation Needs (09/25/2023)  Utilities: Not At Risk (09/25/2023)  Tobacco Use: Medium Risk (09/25/2023)     Readmission Risk Interventions    09/27/2023   10:43 AM  Readmission Risk Prevention Plan  Post Dischage Appt Complete  Medication Screening Complete  Transportation Screening Complete

## 2023-09-27 NOTE — Plan of Care (Signed)

## 2023-09-27 NOTE — Discharge Summary (Signed)
Physician Discharge Summary  Nathan Lamb. WGN:[id number] DOB: [birth year] DOA: 09/25/2023  PCP: Pcp, No  Admit date: 09/25/2023 Discharge date: 09/27/2023  Admitted From: Home Disposition: Home  Recommendations for Outpatient Follow-up:  Follow up with PCP in 1-2 weeks Follow up with GI in 1 to 2 weeks as scheduled:  Home Health: None Equipment/Devices: None  Discharge Condition: Stable CODE STATUS: Full Diet recommendation: Low-salt low-fat low-carb diet  Brief/Interim Summary: Nathan Mahone. is a 32 y.o. male with medical history significant of small bowel Crohn's disease s/p ileal resection x 2 who presents to the ED for evaluation of abdominal pain, nausea and vomiting.   Patient admitted as above with sepsis secondary to colitis likely complicated by Crohn's disease with questionable flare.  At this time patient's symptoms have drastically resolved with supportive care and recommendations by GI.  IV antibiotics with Zosyn will transition to Augmentin for completion of course otherwise continue Zofran as needed for nausea.  Currently on Humira, will follow-up with outpatient GI on resumption of this medication.  He is otherwise stable and agreeable for discharge home.  Discharge Diagnoses:  Principal Problem:   Colitis  Sepsis secondary to colitis, complicated by history of Crohn's disease status post ileal resection x 2. -Symptoms markedly well-controlled now -Advance diet as tolerated -Appreciate GI insight recommendations -Zosyn transition to Augmentin -Patient is on Humira every 14 days with last injection on 11/1   Anemia, of chronic disease/malnutrition -Stable from baseline, likely secondary to Crohn's malnutrition and poor p.o. intake   Thrombocytopenia -Questionably acute phase reactant, follow repeat labs with PCP   Hyperbilirubinemia  -Isolated -possibly reactive, downtrending with supportive care -repeat labs with PCP   Hypokalemia Resolved  with increased p.o. intake  Discharge Instructions  Discharge Instructions     Discharge patient   Complete by: As directed    Discharge disposition: 01-Home or Self Care   Discharge patient date: 09/27/2023      Allergies as of 09/27/2023       Reactions   Nsaids Other (See Comments)   Crohn's disease = NO NSAIDs   Dilaudid [hydromorphone Hcl] Itching        Medication List     STOP taking these medications    Cipro 500 MG tablet Generic drug: ciprofloxacin   metroNIDAZOLE 500 MG tablet Commonly known as: FLAGYL       TAKE these medications    amoxicillin-clavulanate 875-125 MG tablet Commonly known as: AUGMENTIN Take 1 tablet by mouth 2 (two) times daily for 10 days.   Humira Pen 40 MG/0.8ML Ajkt pen Generic drug: adalimumab Inject 40 mg into the skin every 14 (fourteen) days. Last injection 09/24/2023   ondansetron 4 MG tablet Commonly known as: ZOFRAN Take 1 tablet (4 mg total) by mouth every 6 (six) hours.   predniSONE 10 MG tablet Commonly known as: DELTASONE Take 4 tablets (40 mg total) by mouth daily for 4 days.        Allergies  Allergen Reactions   Nsaids Other (See Comments)    Crohn's disease = NO NSAIDs   Dilaudid [Hydromorphone Hcl] Itching    Consultations: GI  Procedures/Studies: CT ABDOMEN PELVIS W CONTRAST  Result Date: 09/23/2023 CLINICAL DATA:  Nausea and vomiting. EXAM: CT ABDOMEN AND PELVIS WITH CONTRAST TECHNIQUE: Multidetector CT imaging of the abdomen and pelvis was performed using the standard protocol following bolus administration of intravenous contrast. RADIATION DOSE REDUCTION: This exam was performed according to the departmental dose-optimization program which includes  automated exposure control, adjustment of the mA and/or kV according to patient size and/or use of iterative reconstruction technique. CONTRAST:  OMNIPAQUE IOHEXOL 300 MG/ML  SOLN COMPARISON:  February 02, 2012 FINDINGS: Lower chest: No acute  abnormality. Hepatobiliary: No focal liver abnormality is seen. No gallstones, gallbladder wall thickening, or biliary dilatation. Pancreas: Unremarkable. No pancreatic ductal dilatation or surrounding inflammatory changes. Spleen: Normal in size without focal abnormality. Adrenals/Urinary Tract: Adrenal glands are unremarkable. Kidneys are normal, without renal calculi, focal lesion, or hydronephrosis. Bladder is unremarkable. Stomach/Bowel: Stomach is within normal limits. Surgically anastomosed bowel is seen within the anterior aspect of the mid right abdomen. The appendix is surgically absent. No evidence of bowel dilatation. Moderate to markedly thickened large bowel is seen involving the distal transverse colon, descending colon and sigmoid colon. Vascular/Lymphatic: No significant vascular findings are present. No enlarged abdominal or pelvic lymph nodes. Reproductive: Prostate is unremarkable. Other: No abdominal wall hernia or abnormality. No abdominopelvic ascites. Musculoskeletal: No acute or significant osseous findings. IMPRESSION: 1. Moderate to marked severity colitis involving the distal transverse colon, descending colon and sigmoid colon. 2. Evidence of prior appendectomy and prior bowel anastomosis. Electronically Signed   By: Aram Candela M.D.   On: 09/23/2023 19:59     Subjective: No acute issues or events overnight tolerating p.o. well otherwise stable for discharge   Discharge Exam: Vitals:   09/27/23 0600 09/27/23 0756  BP: 118/70 124/75  Pulse: (!) 57 (!) 55  Resp: 15 16  Temp: 98.6 F (37 C) 98.3 F (36.8 C)  SpO2: 100% 100%   Vitals:   09/26/23 1618 09/26/23 2100 09/27/23 0600 09/27/23 0756  BP: 116/72 123/71 118/70 124/75  Pulse: 64 67 (!) 57 (!) 55  Resp: 20 14 15 16   Temp: 99.2 F (37.3 C) 98.7 F (37.1 C) 98.6 F (37 C) 98.3 F (36.8 C)  TempSrc: Oral Oral Oral Oral  SpO2: 99% 100% 100% 100%  Weight:      Height:        General: Pt is alert, awake,  not in acute distress Cardiovascular: RRR, S1/S2 +, no rubs, no gallops Respiratory: CTA bilaterally, no wheezing, no rhonchi Abdominal: Soft, NT, ND, bowel sounds + Extremities: no edema, no cyanosis    The results of significant diagnostics from this hospitalization (including imaging, microbiology, ancillary and laboratory) are listed below for reference.     Microbiology: No results found for this or any previous visit (from the past 240 hour(s)).   Labs: BNP (last 3 results) No results for input(s): "BNP" in the last 8760 hours. Basic Metabolic Panel: Recent Labs  Lab 09/23/23 1741 09/25/23 1253 09/26/23 0341 09/27/23 0345  NA 138 140 139 139  K 3.7 3.3* 3.5 3.2*  CL 103 106 107 106  CO2 25 22 24 25   GLUCOSE 99 106* 83 103*  BUN 12 11 7  <5*  CREATININE 0.96 1.15 1.04 1.15  CALCIUM 9.9 9.7 8.5* 8.7*   Liver Function Tests: Recent Labs  Lab 09/23/23 1741 09/25/23 1253 09/26/23 0341  AST 21 26 18   ALT 17 22 22   ALKPHOS 53 54 39  BILITOT 1.9* 2.0* 1.3*  PROT 7.4 7.3 5.9*  ALBUMIN 5.0 4.6 3.4*   Recent Labs  Lab 09/23/23 1741 09/25/23 1253  LIPASE <10* 23   No results for input(s): "AMMONIA" in the last 168 hours. CBC: Recent Labs  Lab 09/23/23 1741 09/25/23 1253 09/26/23 0341  WBC 12.1* 13.1* 8.0  NEUTROABS 10.8*  --   --  HGB 12.7* 12.9* 11.0*  HCT 35.8* 36.6* 31.6*  MCV 92.0 91.5 93.8  PLT 164 163 128*   Cardiac Enzymes: No results for input(s): "CKTOTAL", "CKMB", "CKMBINDEX", "TROPONINI" in the last 168 hours. BNP: Invalid input(s): "POCBNP" CBG: No results for input(s): "GLUCAP" in the last 168 hours. D-Dimer No results for input(s): "DDIMER" in the last 72 hours. Hgb A1c No results for input(s): "HGBA1C" in the last 72 hours. Lipid Profile No results for input(s): "CHOL", "HDL", "LDLCALC", "TRIG", "CHOLHDL", "LDLDIRECT" in the last 72 hours. Thyroid function studies No results for input(s): "TSH", "T4TOTAL", "T3FREE", "THYROIDAB"  in the last 72 hours.  Invalid input(s): "FREET3" Anemia work up No results for input(s): "VITAMINB12", "FOLATE", "FERRITIN", "TIBC", "IRON", "RETICCTPCT" in the last 72 hours. Urinalysis    Component Value Date/Time   COLORURINE YELLOW 09/25/2023 1107   APPEARANCEUR CLEAR 09/25/2023 1107   LABSPEC 1.016 09/25/2023 1107   PHURINE 5.0 09/25/2023 1107   GLUCOSEU NEGATIVE 09/25/2023 1107   HGBUR NEGATIVE 09/25/2023 1107   BILIRUBINUR NEGATIVE 09/25/2023 1107   KETONESUR 20 (A) 09/25/2023 1107   PROTEINUR NEGATIVE 09/25/2023 1107   NITRITE NEGATIVE 09/25/2023 1107   LEUKOCYTESUR NEGATIVE 09/25/2023 1107   Sepsis Labs Recent Labs  Lab 09/23/23 1741 09/25/23 1253 09/26/23 0341  WBC 12.1* 13.1* 8.0   Microbiology No results found for this or any previous visit (from the past 240 hour(s)).   Time coordinating discharge: Over 30 minutes  SIGNED:   Azucena Fallen, DO [facility] 09/27/2023, 3:50 PM Pager   If 7PM-7AM, please contact night-coverage [email or web address]

## 2023-09-27 NOTE — Plan of Care (Signed)

## 2023-09-27 NOTE — Progress Notes (Signed)
Nathan Lamb. to be D/C'd  per MD order.  Discussed with the patient and all questions fully answered.  VSS, Skin clean, dry and intact without evidence of skin break down, no evidence of skin tears noted.  IV catheter discontinued intact. Site without signs and symptoms of complications. Dressing and pressure applied.  An After Visit Summary was printed and given to the patient. Patient received prescription.  D/c education completed with patient/family including follow up instructions, medication list, d/c activities limitations if indicated, with other d/c instructions as indicated by MD - patient able to verbalize understanding, all questions fully answered.   Patient instructed to return to ED, call 911, or call MD for any changes in condition.   Patient to be escorted via WC, and D/C home via private auto.

## 2023-10-04 DIAGNOSIS — A09 Infectious gastroenteritis and colitis, unspecified: Secondary | ICD-10-CM | POA: Diagnosis not present

## 2023-10-13 ENCOUNTER — Encounter: Payer: Self-pay | Admitting: Student

## 2023-10-13 ENCOUNTER — Ambulatory Visit: Payer: BC Managed Care – PPO | Admitting: Student

## 2023-10-13 VITALS — BP 117/75 | HR 87 | Temp 98.6°F | Ht 70.0 in | Wt 180.0 lb

## 2023-10-13 DIAGNOSIS — Z0000 Encounter for general adult medical examination without abnormal findings: Secondary | ICD-10-CM | POA: Insufficient documentation

## 2023-10-13 DIAGNOSIS — K529 Noninfective gastroenteritis and colitis, unspecified: Secondary | ICD-10-CM

## 2023-10-13 DIAGNOSIS — K50919 Crohn's disease, unspecified, with unspecified complications: Secondary | ICD-10-CM

## 2023-10-13 DIAGNOSIS — Z09 Encounter for follow-up examination after completed treatment for conditions other than malignant neoplasm: Secondary | ICD-10-CM | POA: Insufficient documentation

## 2023-10-13 NOTE — Assessment & Plan Note (Signed)
He was recently hospitalized for colitis and has completed his antibiotic course with resolution of his symptoms. He is afebrile today. No new concerns.

## 2023-10-13 NOTE — Assessment & Plan Note (Addendum)
Offered a flu shot and COVID vaccine, patient deferred at this time.

## 2023-10-13 NOTE — Assessment & Plan Note (Signed)
While hospitalized, he was found to have an elevated bilirubin, thrombocytopenia, and normocytic anemia. We will collect a CBC and CMP today to assess for resolution of these abnormal laboratory values.

## 2023-10-13 NOTE — Assessment & Plan Note (Addendum)
He was diagnosed with Crohn's disease around 2007 and has had several complications including a small bowel perforation and ileal resection. He denies any current symptoms including abdominal pain, bowel changes, blood in stool, or fatigue. He feels it is well controlled on Humira 40 mg injections every two weeks. He had a recent outpatient follow up with Gastroenterology on 11/11. Will defer management of his Crohn's disease to his gastroenterologist.

## 2023-10-13 NOTE — Progress Notes (Signed)
   CC: Establish care  HPI:  [name] is a 32 y.o. male living with a history stated below and presents today to establish care. Please see problem based assessment and plan for additional details.  Past Medical History:  Crohn's disease (diagnosed in 2007-8) Pilonidal disease (diagnosed in 03/2023) Appendicitis (in 2007) Small bowel perforation (in 2007)  Past Surgical History:  Ileal resection x 2  Adhesiolysis Appendectomy Incision and drainage x 2 (pilonidal cysts)  Medications:  Humira 40 mg injection every 14 days, last one on 11/15  Allergies:  Dilaudid (hives)  Family History: High blood pressure, thyroid cancer (father) Paternal grandmother (lung cancer, maybe colon cancer) Paternal grandfather (lung cancer)  Social History:  Lives with parents in [place]. Has an older brother and sister, live nearby. Has seven pitbulls. Works at the [employer] in [place], full-time.  Alcohol: One shot of liquor every few days. A beer every few days.  Drugs: No other drug use.  Tobacco: Used to smoke black and milds occasionally.   Review of Systems: ROS negative except for what is noted on the assessment and plan.  Vitals:   10/13/23 1524  BP: 117/75  Pulse: 87  Temp: 98.6 F (37 C)  TempSrc: Oral  SpO2: 99%  Weight: 180 lb (81.6 kg)  Height: 5\' 10"  (1.778 m)   Physical Exam: Constitutional: well-appearing, in no acute distress HENT: normocephalic atraumatic, mucous membranes moist Eyes: conjunctiva non-erythematous Neck: supple Cardiovascular: regular rate and rhythm, no m/r/g Pulmonary/Chest: normal work of breathing on room air, lungs clear to auscultation bilaterally Abdominal: soft, non-tender; midline surgical scar extending from pubis to umbilicus from prior bowel resection; several laparoscopic scars on lateral aspects of abdomen; no distension MSK: normal bulk and tone Neurological: alert & oriented x 3, 5/5 strength in bilateral upper  and lower extremities Skin: warm and dry  Assessment & Plan:   Crohn's disease He was diagnosed with Crohn's disease around 2007 and has had several complications including a small bowel perforation and ileal resection. He denies any current symptoms including abdominal pain, bowel changes, blood in stool, or fatigue. He feels it is well controlled on Humira 40 mg injections every two weeks. He had a recent outpatient follow up with Gastroenterology on 11/11. Will defer management of his Crohn's disease to his gastroenterologist.   Hospital discharge follow-up While hospitalized, he was found to have an elevated bilirubin, thrombocytopenia, and normocytic anemia. We will collect a CBC and CMP today to assess for resolution of these abnormal laboratory values.   Healthcare maintenance Offered a flu shot and COVID vaccine, patient deferred at this time.   Colitis He was recently hospitalized for colitis and has completed his antibiotic course with resolution of his symptoms. He is afebrile today. No new concerns.  Patient seen with Dr. Jerelene Redden, M.D. [facility] Internal Medicine, PGY-1 Pager: [phone] Date 10/13/2023 Time 4:16 PM

## 2023-10-13 NOTE — Patient Instructions (Signed)
Thank you, Nathan Lamb. for allowing [place] to provide your care today, and welcome to our clinic!   We spoke about your medical history and ordered some follow up labs from your recent hospitalization including a complete blood count and a complete metabolic panel.   We will let you know as soon as we receive these lab results.  Follow up: 6 months   We look forward to seeing you next time. Please call our clinic at [phone] if you have any questions or concerns. The best time to call is Monday-Friday from 9am-4pm, but there is someone available 24/7. If after hours or the weekend, call the main hospital number and ask for the Internal Medicine Resident On-Call. If you need medication refills, please notify your pharmacy one week in advance and they will send [place] a request.   Thank you for trusting me with your care. Wishing you the best!   Annett Fabian, MD [facility]

## 2023-10-14 LAB — CBC
Hematocrit: 33.4 % — ABNORMAL LOW (ref 37.5–51.0)
Hemoglobin: 11.7 g/dL — ABNORMAL LOW (ref 13.0–17.7)
MCH: 32.6 pg (ref 26.6–33.0)
MCHC: 35 g/dL (ref 31.5–35.7)
MCV: 93 fL (ref 79–97)
Platelets: 247 10*3/uL (ref 150–450)
RBC: 3.59 x10E6/uL — ABNORMAL LOW (ref 4.14–5.80)
RDW: 16.4 % — ABNORMAL HIGH (ref 11.6–15.4)
WBC: 4.8 10*3/uL (ref 3.4–10.8)

## 2023-10-14 LAB — CMP14 + ANION GAP
ALT: 24 [IU]/L (ref 0–44)
AST: 21 [IU]/L (ref 0–40)
Albumin: 4.7 g/dL (ref 4.3–5.2)
Alkaline Phosphatase: 63 [IU]/L (ref 44–121)
Anion Gap: 14 mmol/L (ref 10.0–18.0)
BUN/Creatinine Ratio: 5 — ABNORMAL LOW (ref 9–20)
BUN: 5 mg/dL — ABNORMAL LOW (ref 6–20)
Bilirubin Total: 0.8 mg/dL (ref 0.0–1.2)
CO2: 22 mmol/L (ref 20–29)
Calcium: 8.9 mg/dL (ref 8.7–10.2)
Chloride: 105 mmol/L (ref 96–106)
Creatinine, Ser: 0.97 mg/dL (ref 0.76–1.27)
Globulin, Total: 1.8 g/dL (ref 1.5–4.5)
Glucose: 80 mg/dL (ref 70–99)
Potassium: 3.8 mmol/L (ref 3.5–5.2)
Sodium: 141 mmol/L (ref 134–144)
Total Protein: 6.5 g/dL (ref 6.0–8.5)
eGFR: 108 mL/min/{1.73_m2} (ref >59)

## 2023-10-20 NOTE — Progress Notes (Signed)
Internal Medicine Clinic Attending  I was physically present during the key portions of the resident provided service and participated in the medical decision making of patient's management care. I reviewed pertinent patient test results.  The assessment, diagnosis, and plan were formulated together and I agree with the documentation in the resident's note.  Gust Rung, DO

## 2023-10-20 NOTE — Addendum Note (Signed)
Addended by: Carlynn Purl C on: 10/20/2023 03:20 PM   Modules accepted: Level of Service

## 2024-10-04 DIAGNOSIS — K5090 Crohn's disease, unspecified, without complications: Secondary | ICD-10-CM | POA: Diagnosis not present
